# Patient Record
Sex: Male | Born: 1960 | Race: Black or African American | Hispanic: No | Marital: Married | Smoking: Current every day smoker
Health system: Southern US, Community
[De-identification: ages and names within clinical notes are randomized; demographics above are authoritative.]

## PROBLEM LIST (undated history)

## (undated) DIAGNOSIS — G2581 Restless legs syndrome: Secondary | ICD-10-CM

## (undated) DIAGNOSIS — I10 Essential (primary) hypertension: Secondary | ICD-10-CM

## (undated) DIAGNOSIS — N529 Male erectile dysfunction, unspecified: Secondary | ICD-10-CM

## (undated) DIAGNOSIS — M5430 Sciatica, unspecified side: Secondary | ICD-10-CM

---

## 2006-07-05 ENCOUNTER — Emergency Department: Payer: Self-pay | Admitting: Emergency Medicine

## 2007-09-22 ENCOUNTER — Other Ambulatory Visit: Payer: Self-pay

## 2007-09-22 ENCOUNTER — Emergency Department: Payer: Self-pay | Admitting: Emergency Medicine

## 2007-09-22 IMAGING — CR DG CHEST 1V PORT
1 series · 1 of 1 positions shown · non-contrast
Comparison: none

REASON FOR EXAM: chest pain
COMMENTS:

PROCEDURE:     DXR - DXR PORTABLE CHEST SINGLE VIEW  - September 22, 2007  [DATE]
RESULT:     The lung fields are clear. The heart, mediastinal and osseous
structures show no acute changes.

[view not recorded]
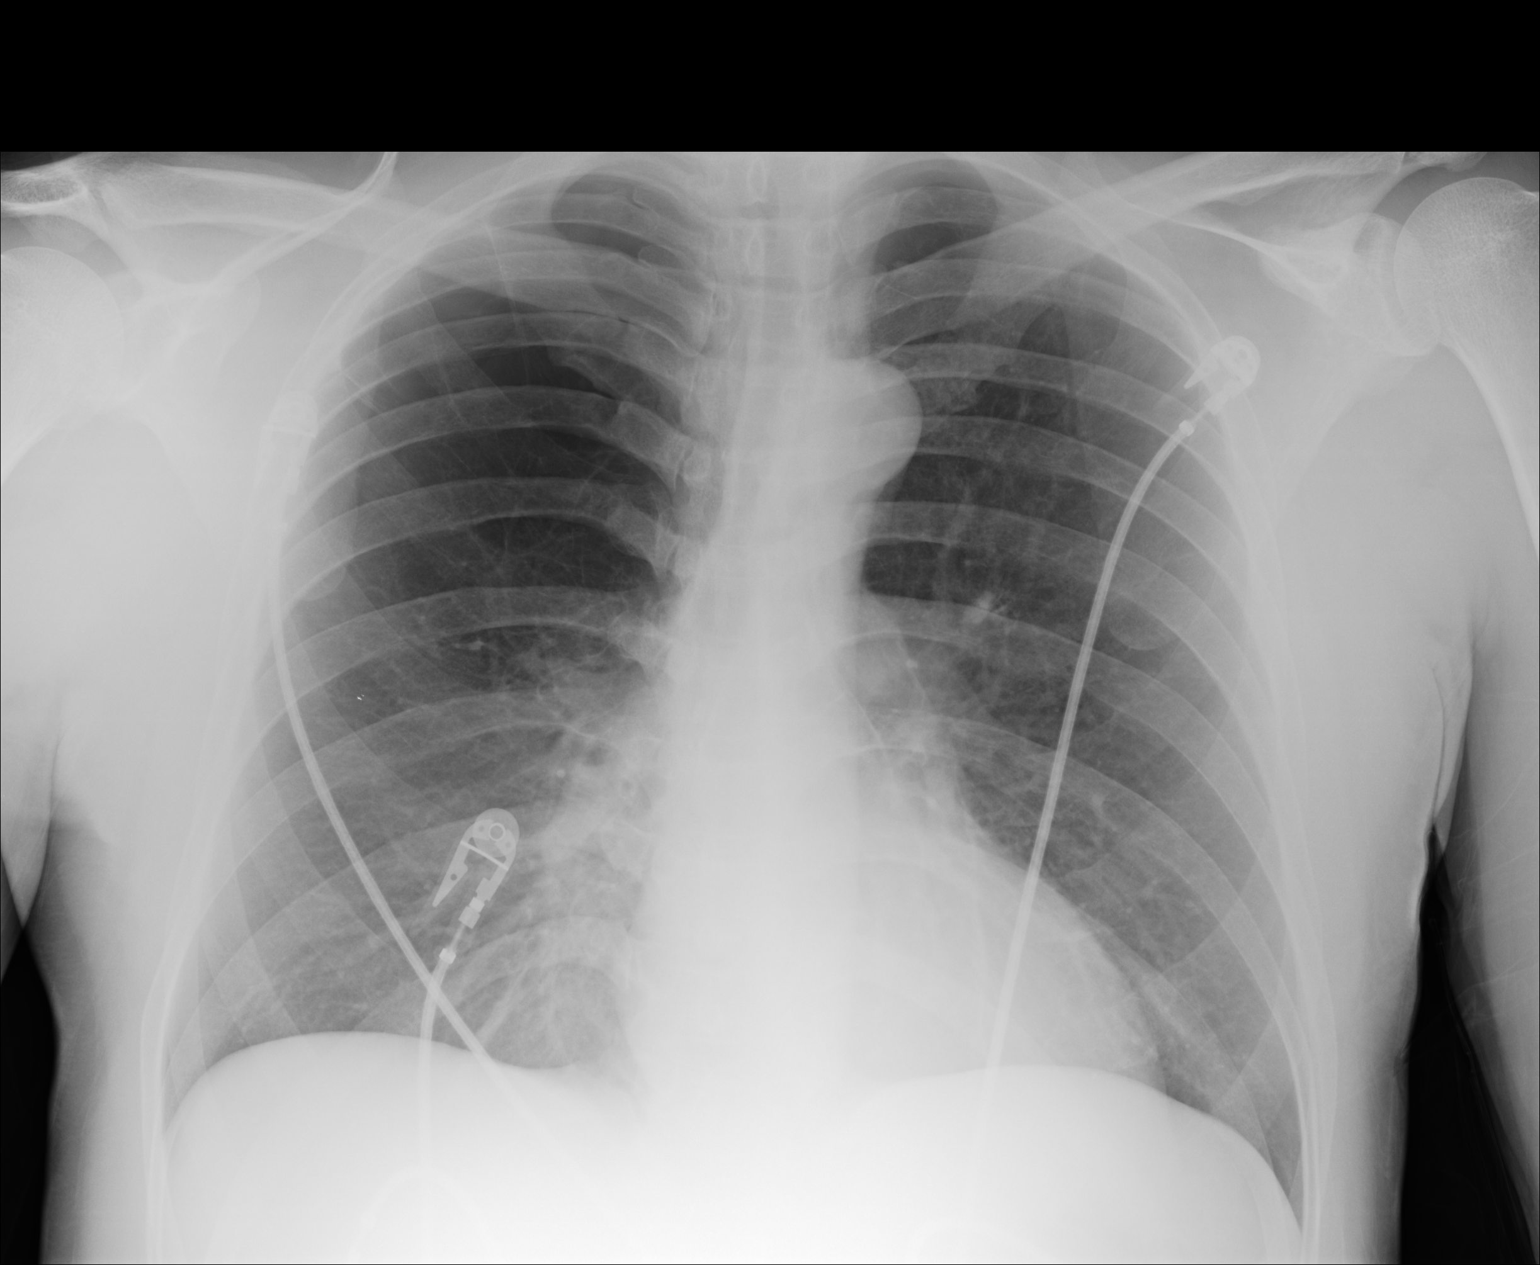

[1 of 1 positions shown; findings below may reference images not displayed]

IMPRESSION: 1.     No acute changes are identified.

## 2008-01-15 ENCOUNTER — Other Ambulatory Visit: Payer: Self-pay

## 2008-01-15 ENCOUNTER — Emergency Department: Payer: Self-pay | Admitting: Emergency Medicine

## 2008-01-15 IMAGING — CT CT HEAD WITHOUT CONTRAST
2 series · 16 of 30 positions shown, 20 images · non-contrast
Comparison: none

REASON FOR EXAM: headache htn
COMMENTS:

PROCEDURE:     CT  - CT HEAD WITHOUT CONTRAST  - January 15, 2008  [DATE]
RESULT:     Comparison: No comparison
TECHNIQUE: Multiple axial images from the foramen magnum to the vertex were
obtained without IV contrast.

[Series 2: without · axial · non-contrast · 0.39mm/px · z∈[-134,-10]mm · 13 of 31 slices shown, 17 images]
[im 3/31  brain]
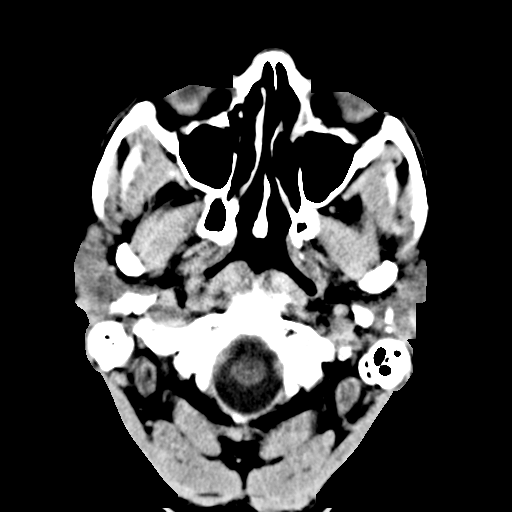
[im 3/31  bone]
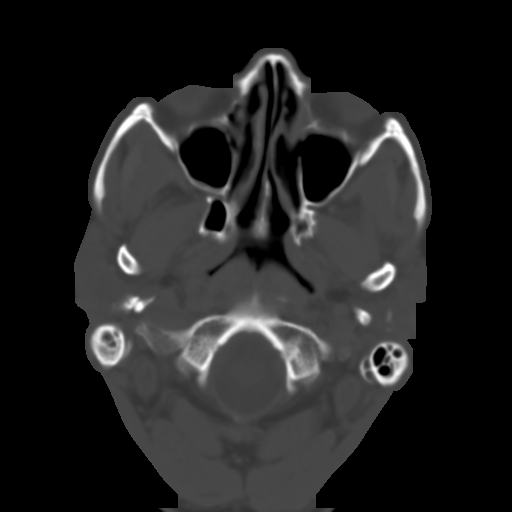
[im 5/31  brain]
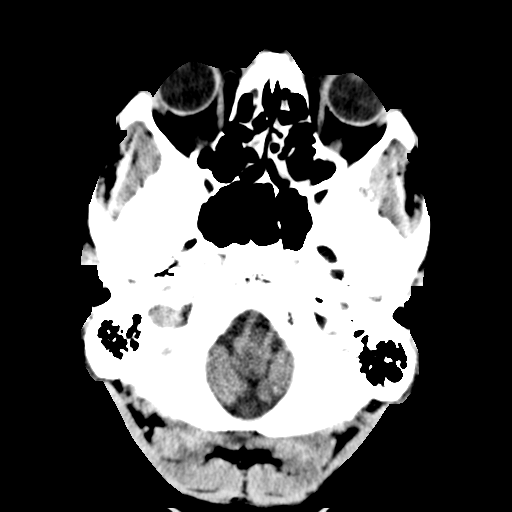
[im 7/31  brain]
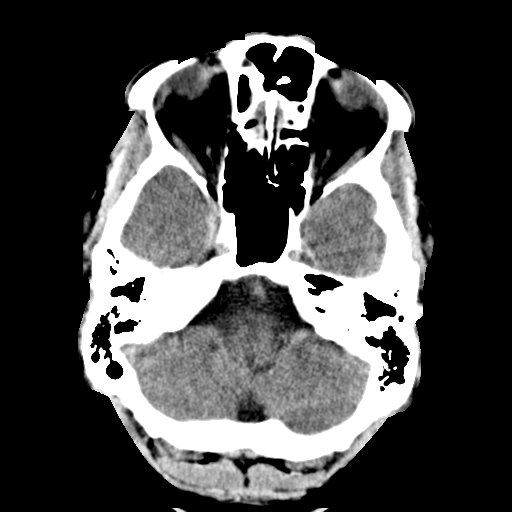
[im 9/31  brain]
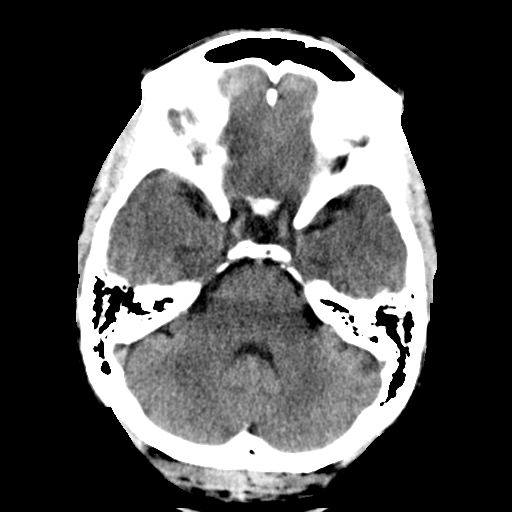
[im 11/31  brain]
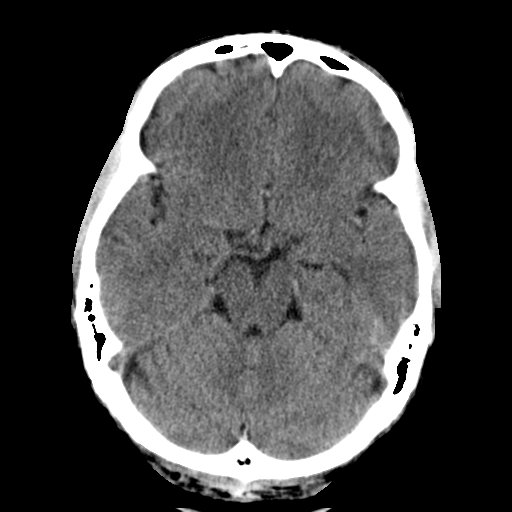
[im 11/31  bone]
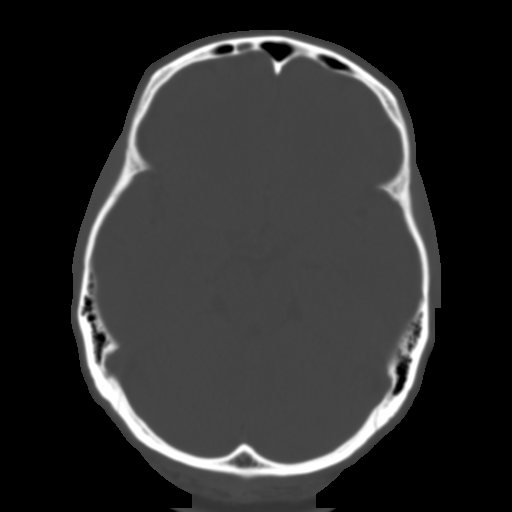
[im 13/31  brain]
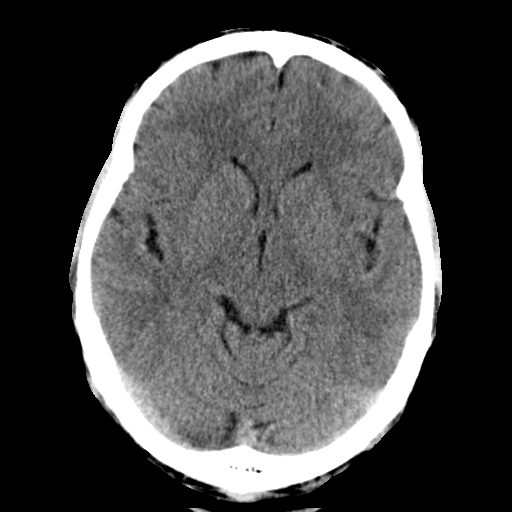
[im 16/31  brain]
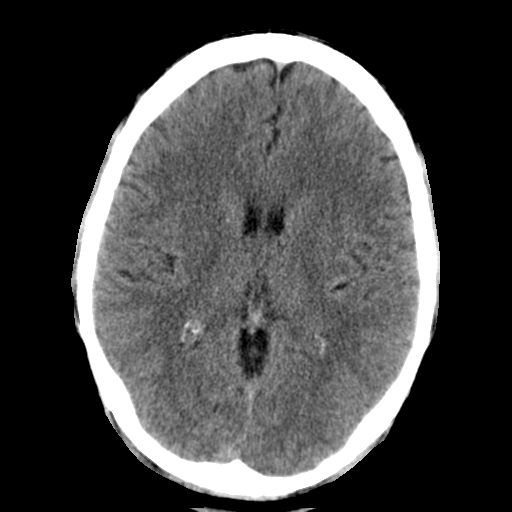
[im 18/31  brain]
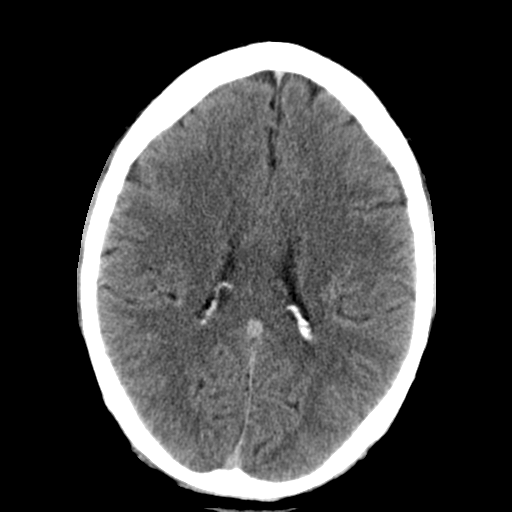
[im 20/31  brain]
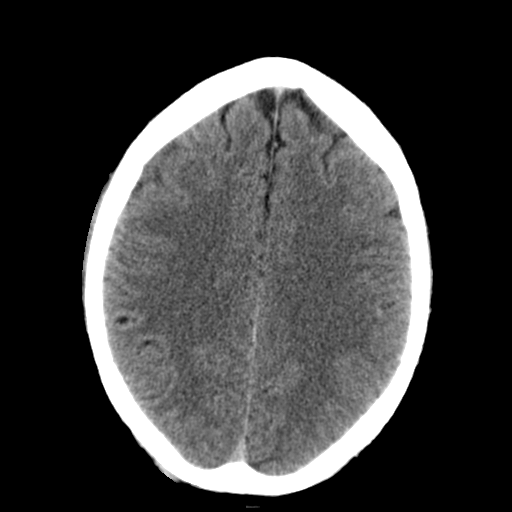
[im 20/31  bone]
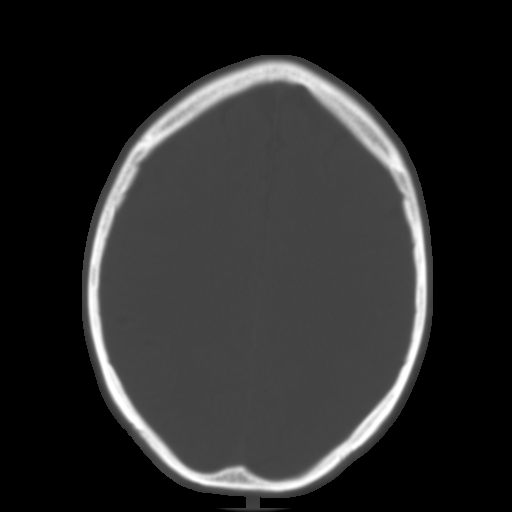
[im 22/31  brain]
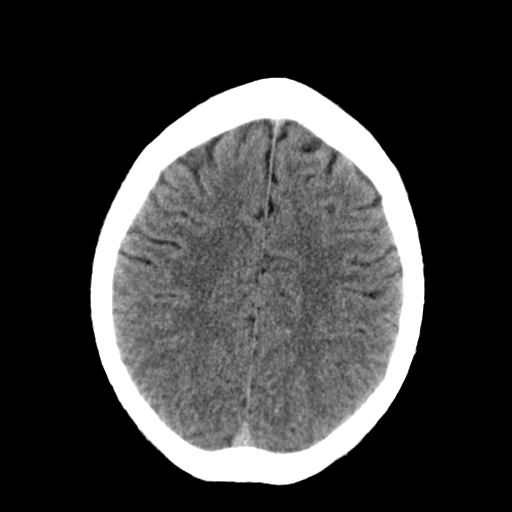
[im 24/31  brain]
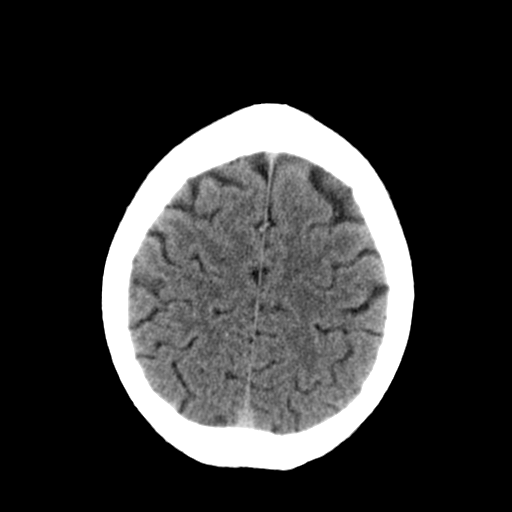
[im 26/31  brain]
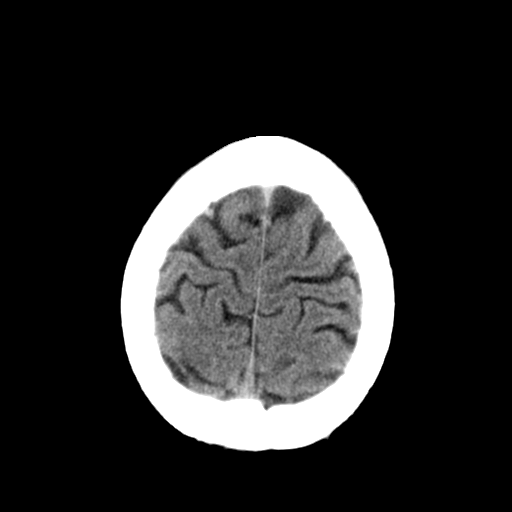
[im 28/31  brain]
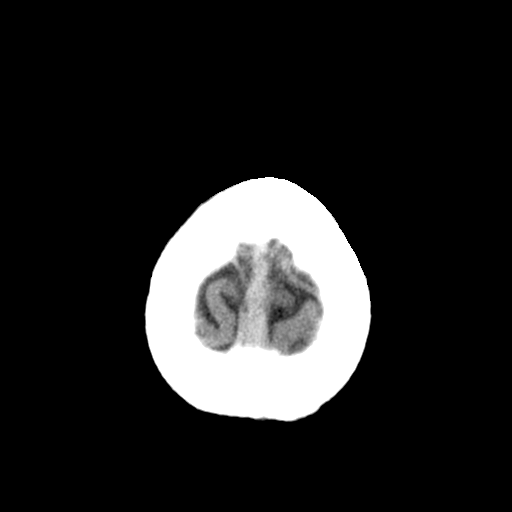
[im 28/31  bone]
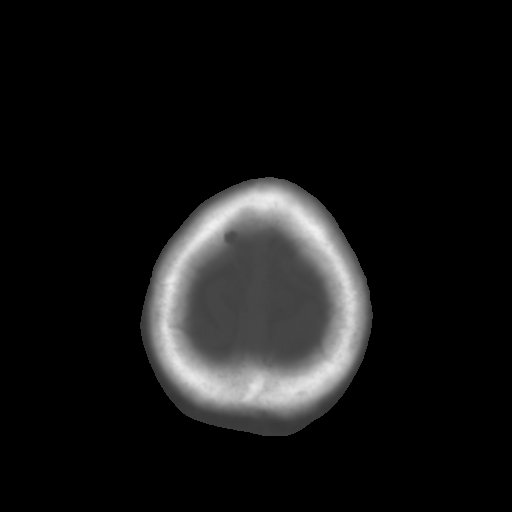

[Series 3: bone · axial · 0.39mm/px · z∈[-134,-94]mm · 3 of 31 slices shown]
[im 3/31  bone]
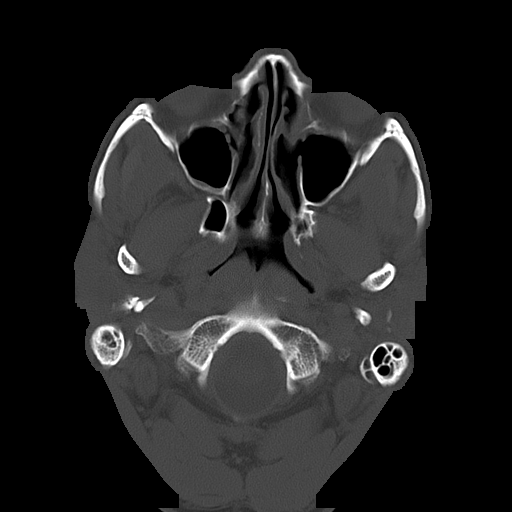
[im 7/31  bone]
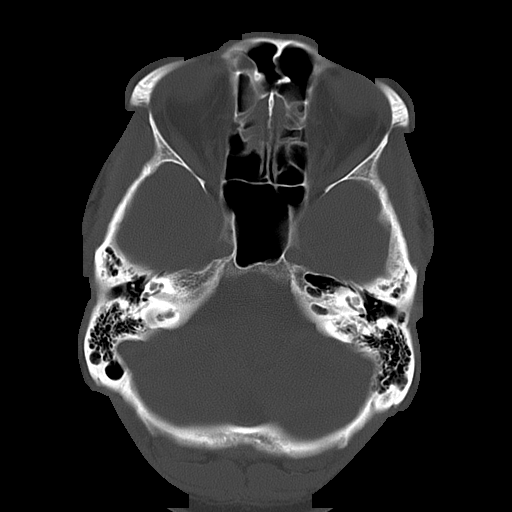
[im 11/31  bone]
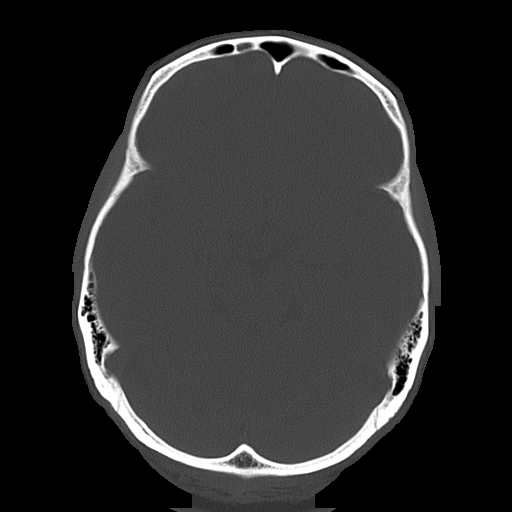

[16 of 30 positions shown; findings below may reference images not displayed]

FINDINGS: There is no evidence for mass effect, midline shift, or extra-axial fluid
collections.  There is no evidence for space-occupying lesion or
intracranial hemorrhage. There is no evidence for cortical-based area of
infarction.

Ventricles and sulci are appropriate for the patient's age. The basal
cisterns are patent.

Visualized portions of the orbits are unremarkable. Small air-fluid level in
the right maxillary sinus. The costal thickening in the ethmoid sinuses.

The osseous structures are unremarkable.
IMPRESSION: No acute intracranial process.
Sinus disease as described above.

## 2010-05-27 ENCOUNTER — Emergency Department: Payer: Self-pay | Admitting: Unknown Physician Specialty

## 2010-05-27 IMAGING — CT CT HEAD WITHOUT CONTRAST
2 series · 15 of 30 positions shown, 19 images · non-contrast
Comparison: none

REASON FOR EXAM: htn ha
COMMENTS:

[Series 2: without · axial · non-contrast · 0.43mm/px · z∈[-106,+20]mm · 13 of 31 slices shown, 17 images]
[im 3/31  brain]
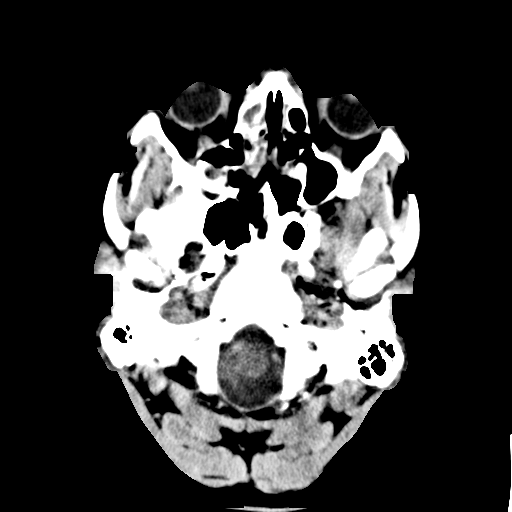
[im 3/31  bone]
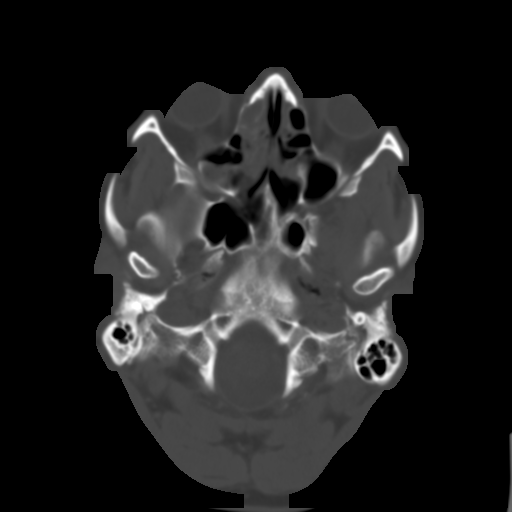
[im 5/31  brain]
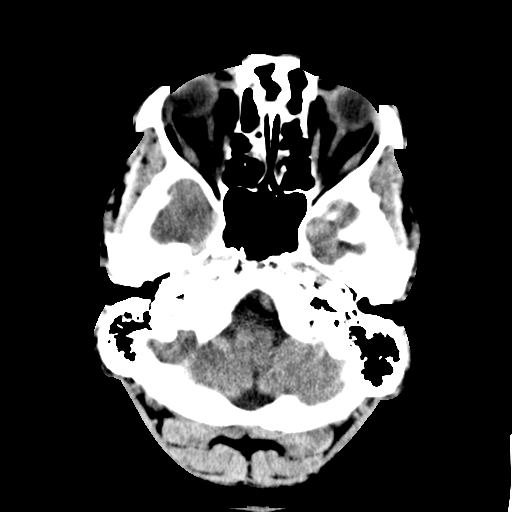
[im 7/31  brain]
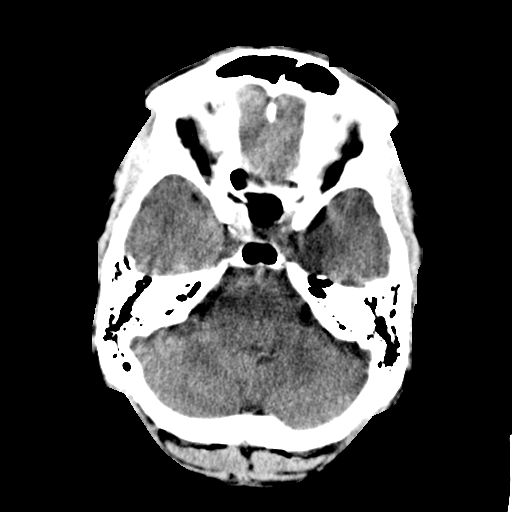
[im 9/31  brain]
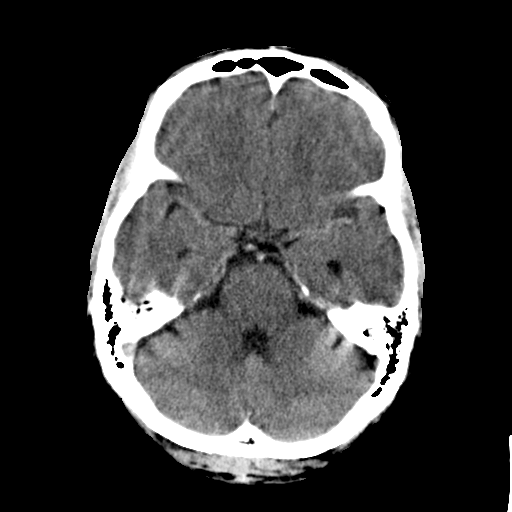
[im 11/31  brain]
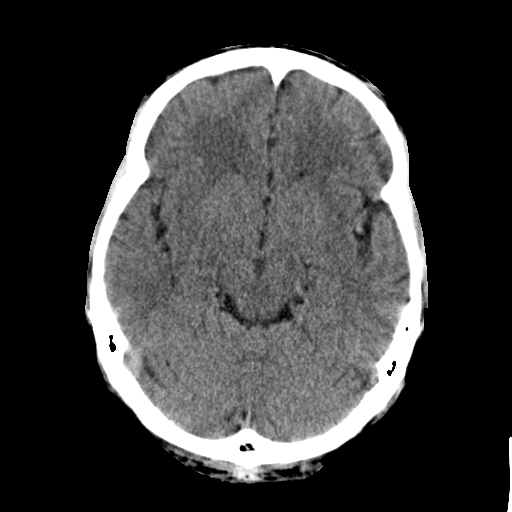
[im 11/31  bone]
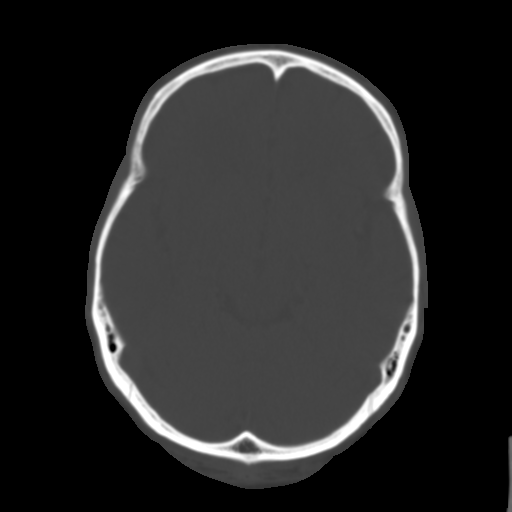
[im 13/31  brain]
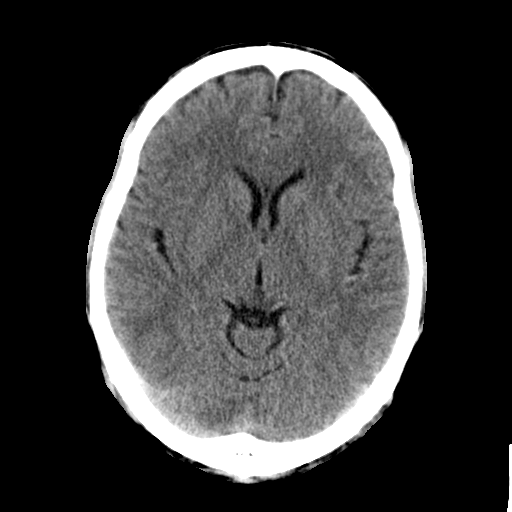
[im 16/31  brain]
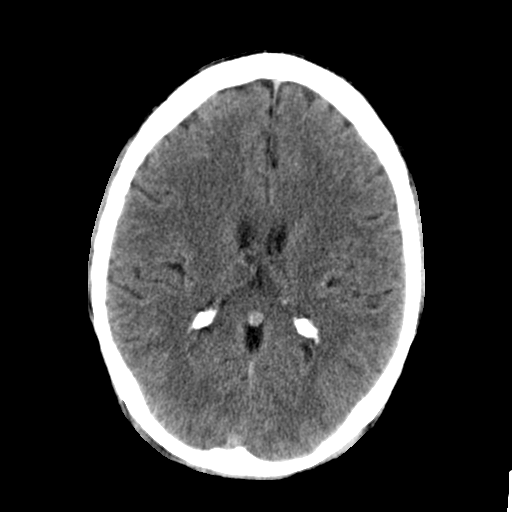
[im 18/31  brain]
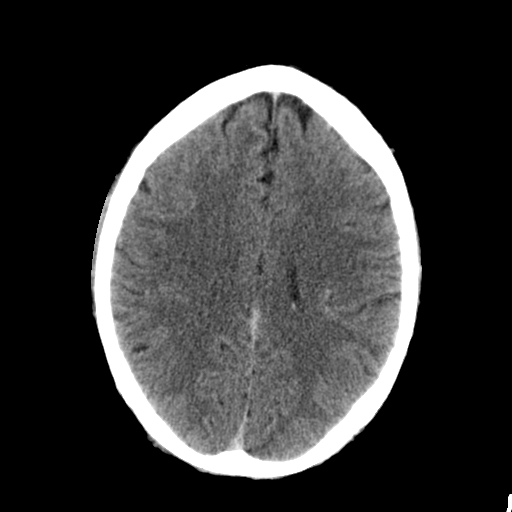
[im 20/31  brain]
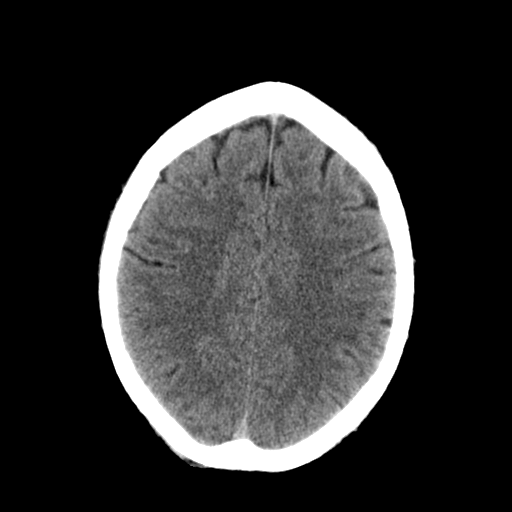
[im 20/31  bone]
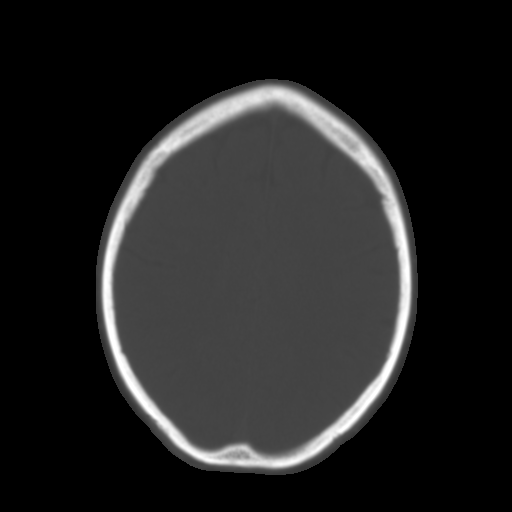
[im 22/31  brain]
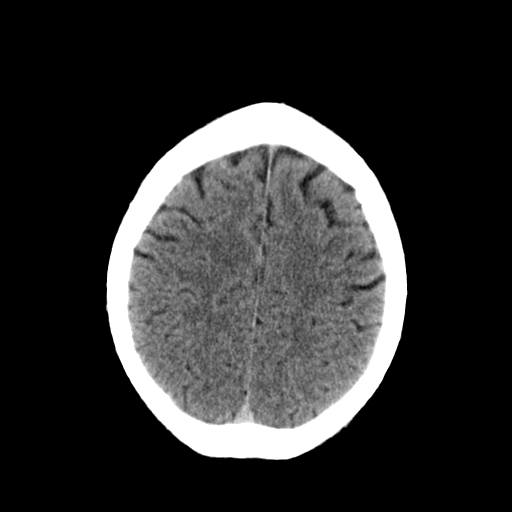
[im 24/31  brain]
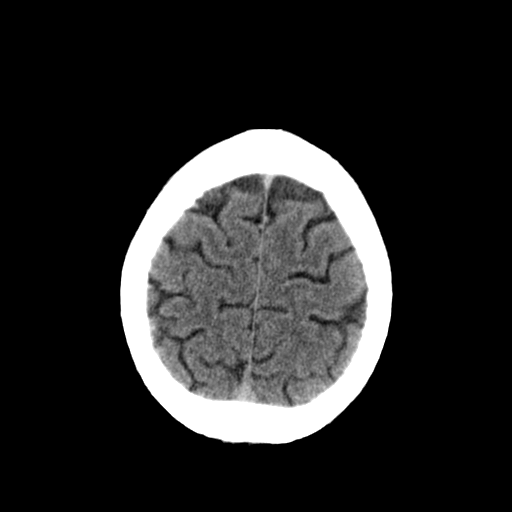
[im 26/31  brain]
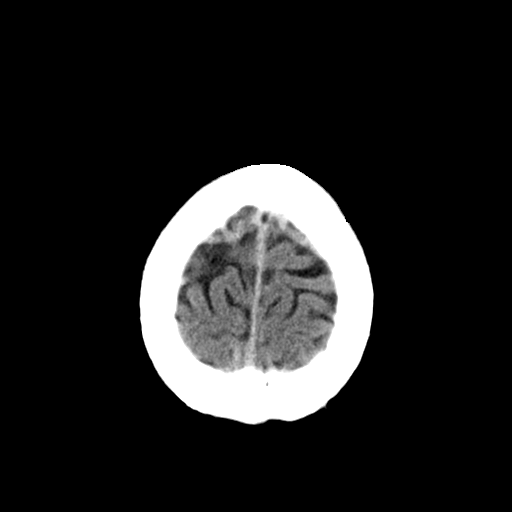
[im 28/31  brain]
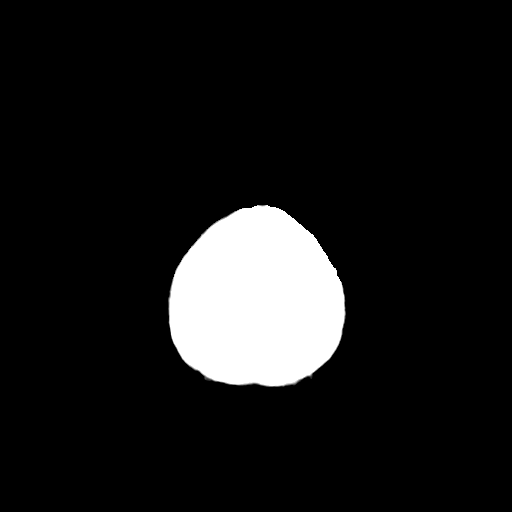
[im 28/31  bone]
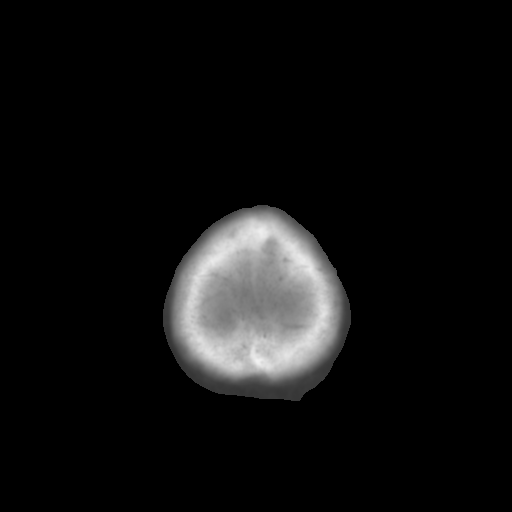

[Series 3: bone · axial · 0.43mm/px · z∈[-106,-86]mm · 2 of 31 slices shown]
[im 3/31  bone]
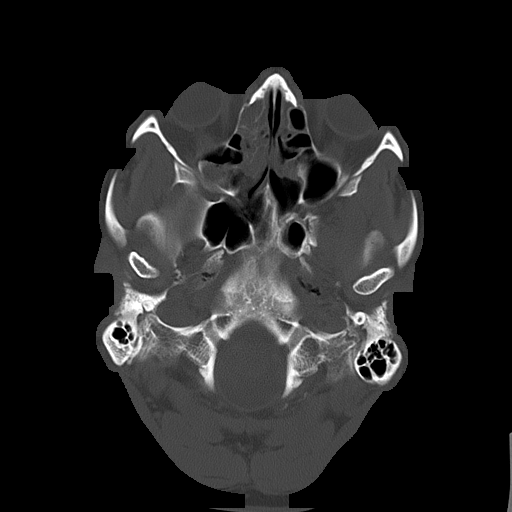
[im 7/31  bone]
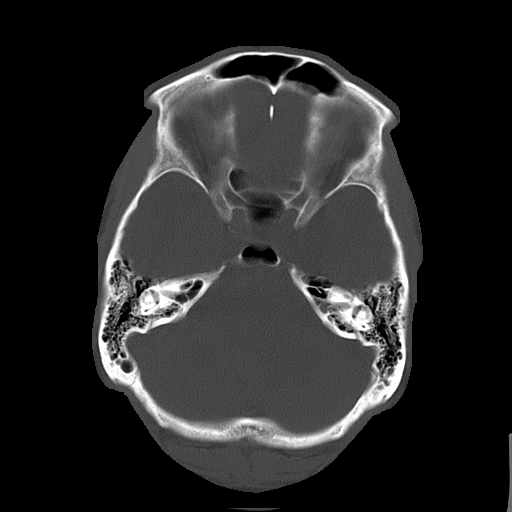

[15 of 30 positions shown; findings below may reference images not displayed]

PROCEDURE:     CT  - CT HEAD WITHOUT CONTRAST  - May 27, 2010  [DATE]

RESULT:     Axial noncontrast CT scanning was performed through the brain at
5 mm intervals and slice thicknesses. Comparison is made to the study 15 January, 2008.

The ventricles are normal in size and position. There is no intracranial
hemorrhage nor intracranial mass effect. The cerebellum and brainstem are
normal in density. At bone window settings there is mucoperiosteal
thickening within the ethmoid sinuses bilaterally as well as within the
right maxillary sinus. There may be a small amount of fluid in the right
maxillary sinus as well. There is no evidence of an acute skull fracture.
IMPRESSION: 1. I do not see evidence of an acute intracranial hemorrhage nor of an
evolving ischemic infarction.
2. There is no intracranial mass effect or hydrocephalus.
3. There is sinus inflammation involving the ethmoid and right maxillary
sinuses. There may be a small air-fluid level in the right maxillary sinus.

## 2010-05-27 IMAGING — CR DG CHEST 2V
1 series · 2 of 2 positions shown · non-contrast
Comparison: none

REASON FOR EXAM: htn
COMMENTS:

[Series 1: view not recorded · 0.17mm/px · 2 of 2 slices shown]
[im 1/2]
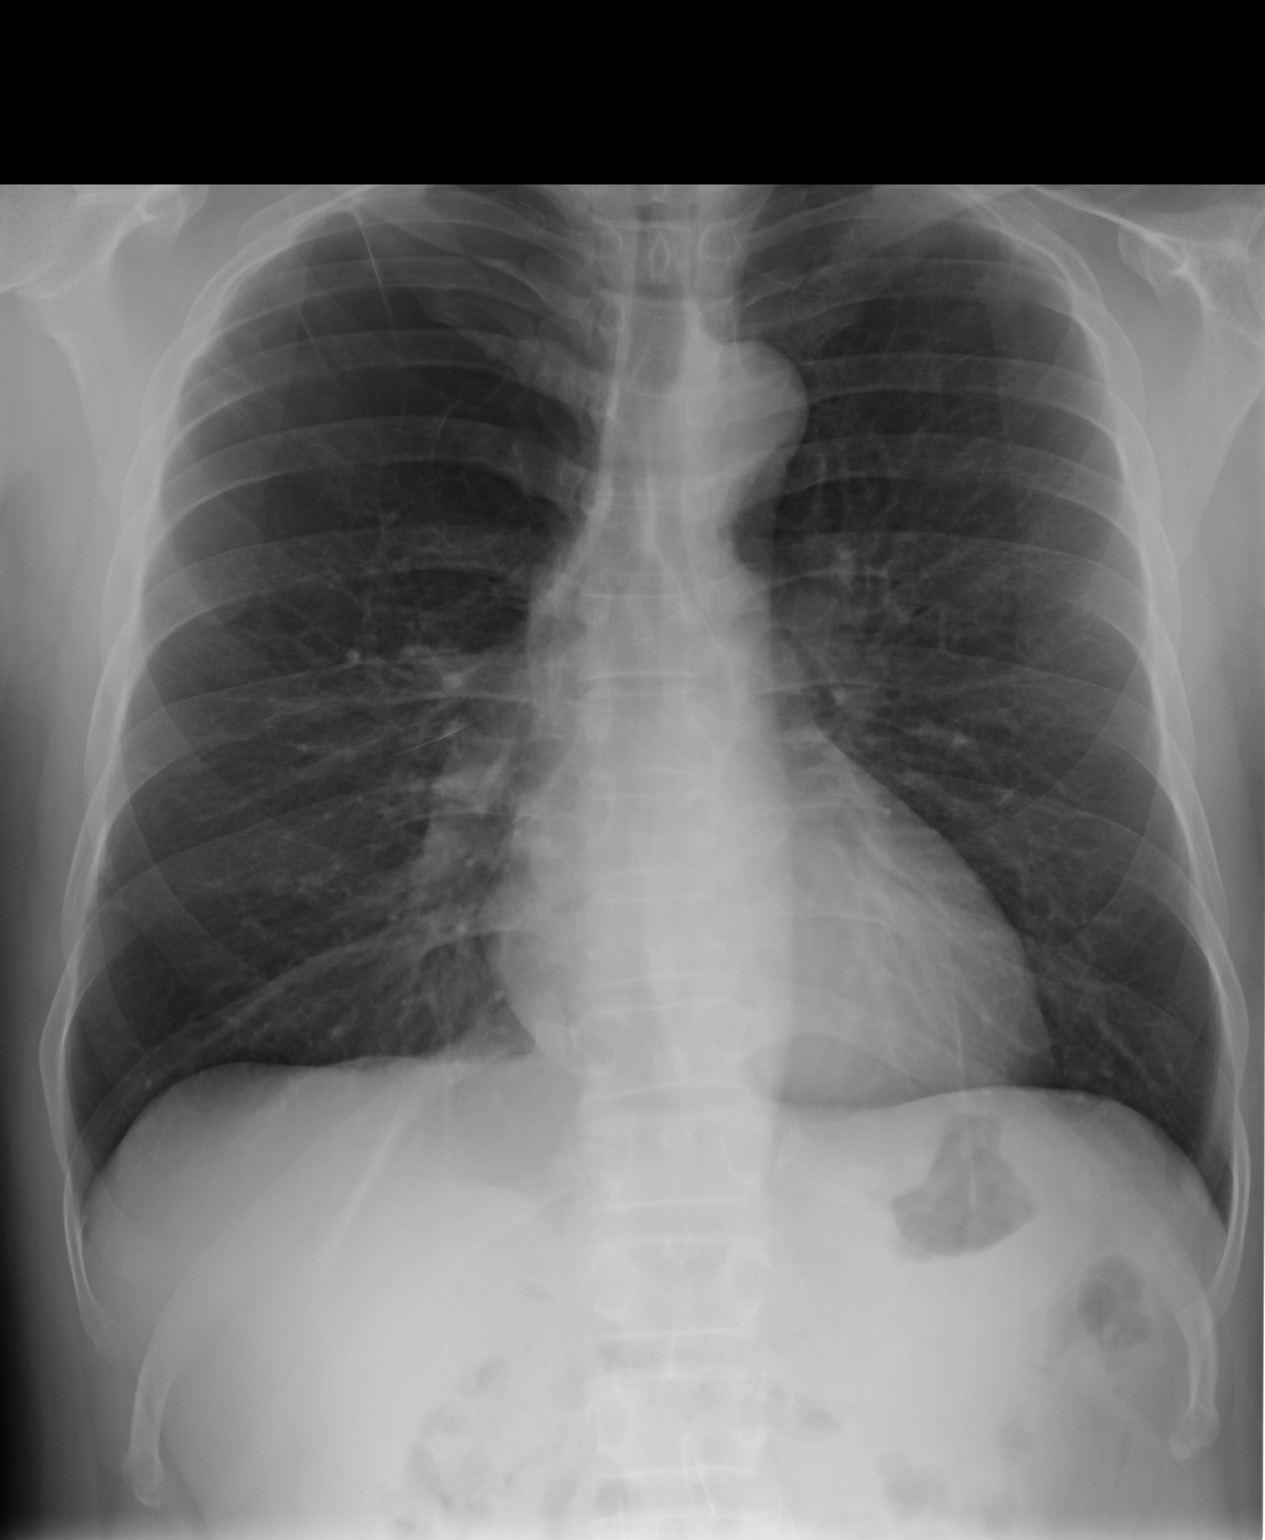
[im 2/2]
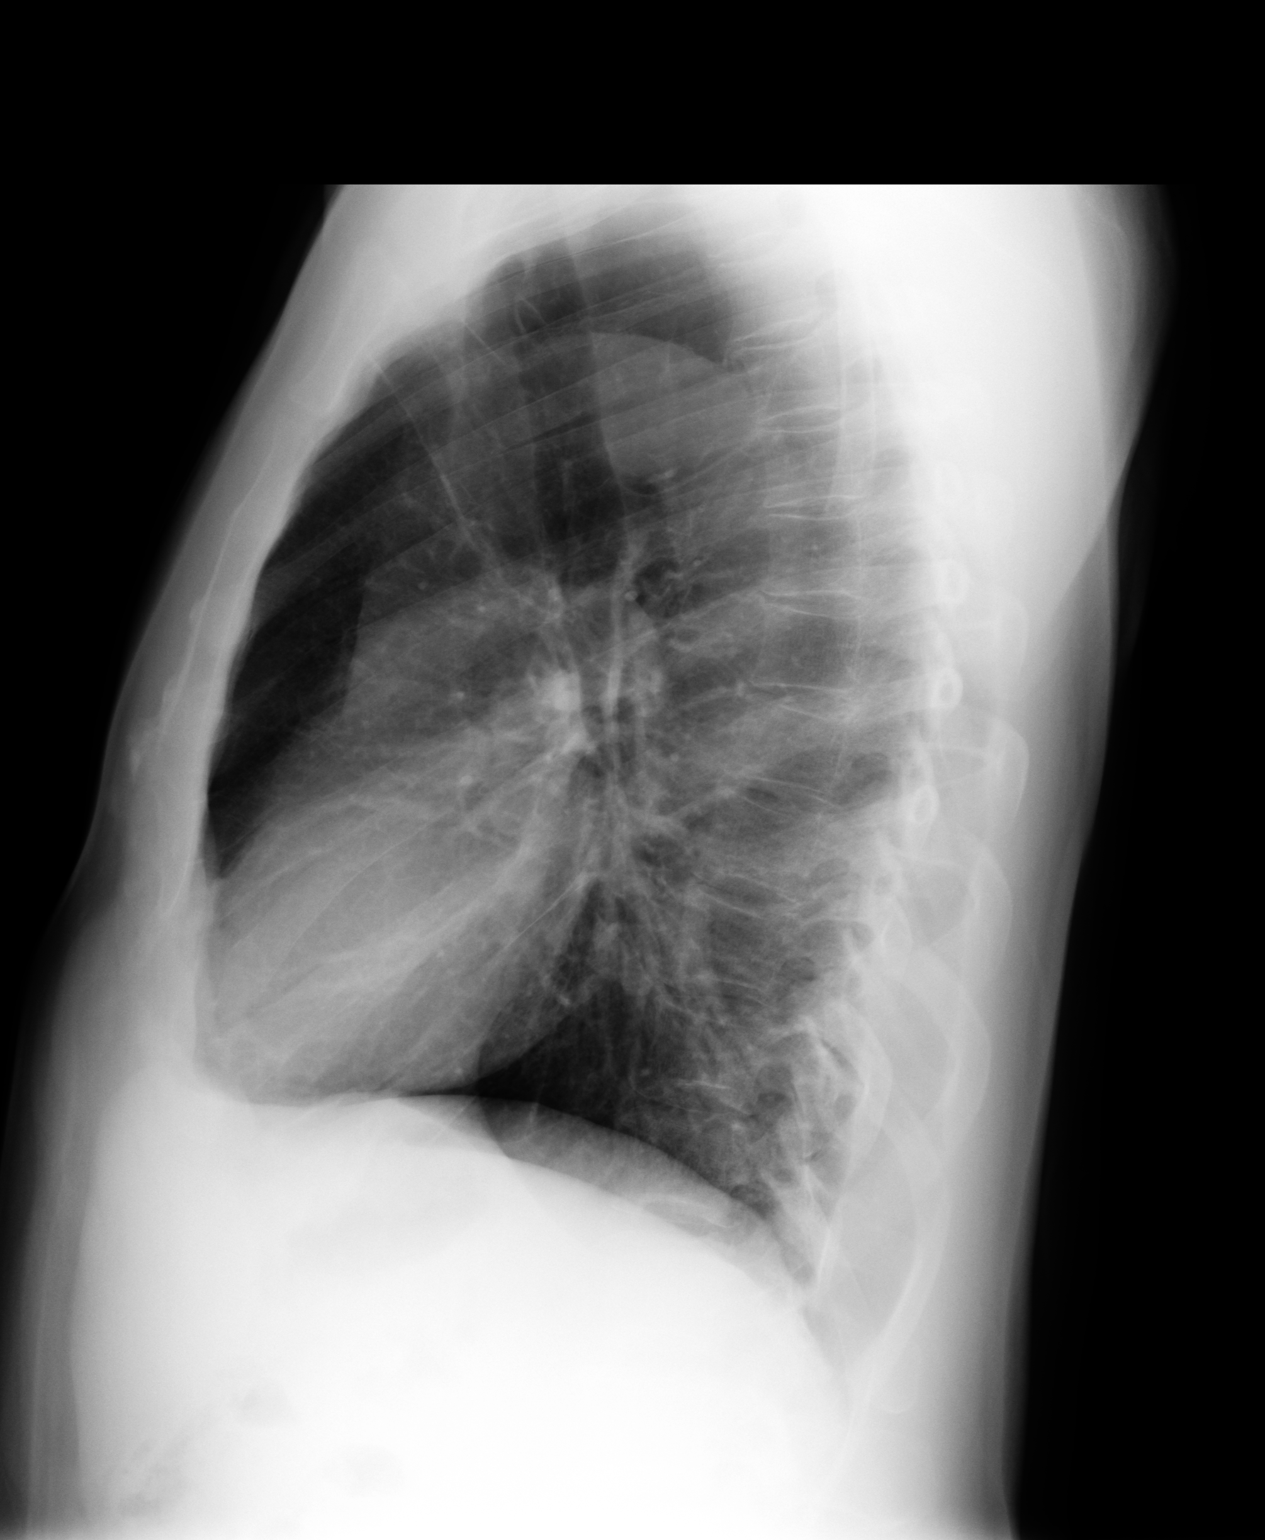

[2 of 2 positions shown; findings below may reference images not displayed]

PROCEDURE:     DXR - DXR CHEST PA (OR AP) AND LATERAL  - May 27, 2010  [DATE]

RESULT:     Comparison made to study 22 September, 2007.

The lungs are mildly hyperinflated. There is hemidiaphragm flattening on the
lateral film. The cardiac silhouette is normal in size. The pulmonary
vascularity is not engorged. There may be bullous lesions in the right
pulmonary apex. The mediastinum is not abnormally widened. I see no pleural
effusion.
IMPRESSION: The findings are consistent with COPD. I do not see
evidence of CHF nor of pneumonia.

## 2010-09-13 ENCOUNTER — Emergency Department: Payer: Self-pay | Admitting: Emergency Medicine

## 2010-09-13 IMAGING — US US EXTREM LOW VENOUS*R*
1 series · 14 of 24 positions shown · non-contrast
Comparison: none

REASON FOR EXAM: leg pain   pt in ED waiting room
COMMENTS:

[Series 1: us extrem low venous*right* · 14 of 24 slices shown]
[im 1/24]
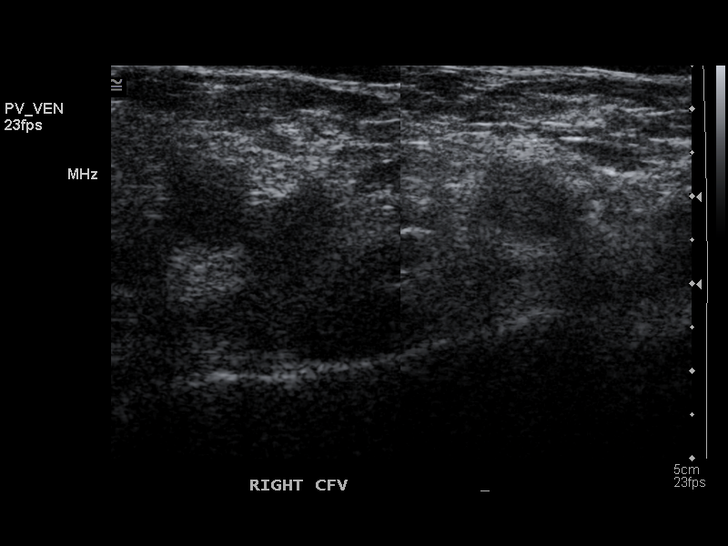
[im 3/24]
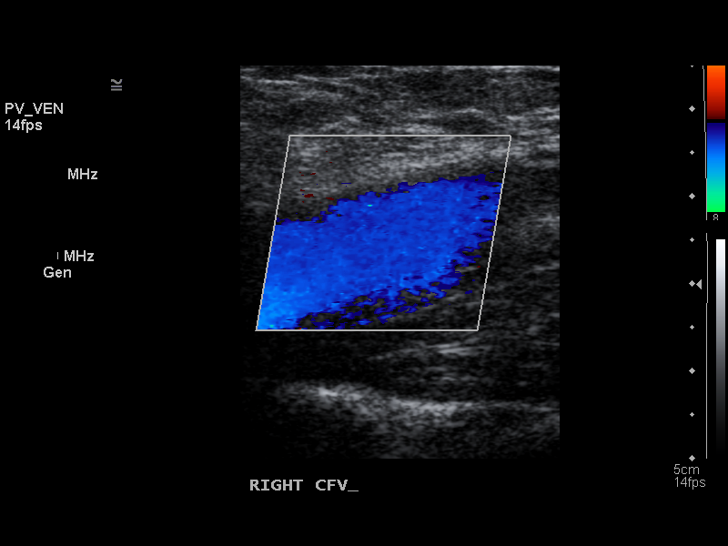
[im 5/24]
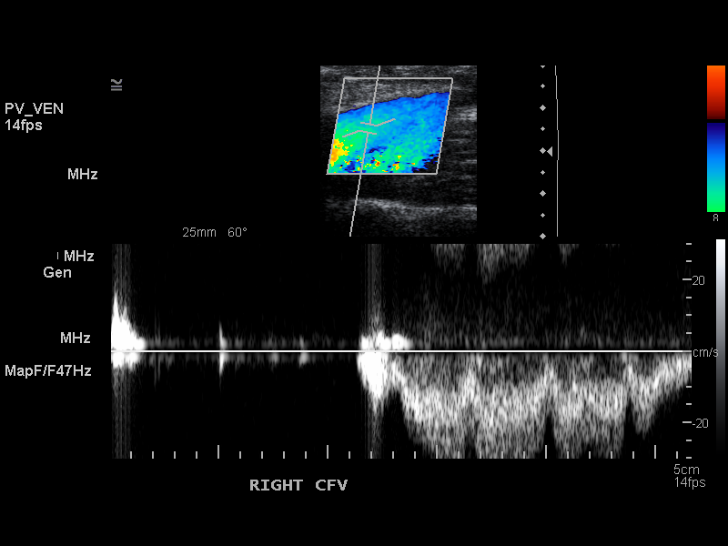
[im 7/24]
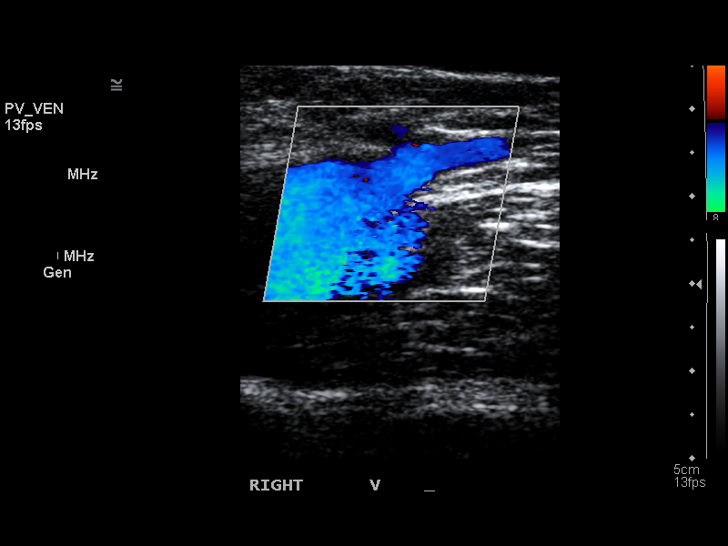
[im 8/24]
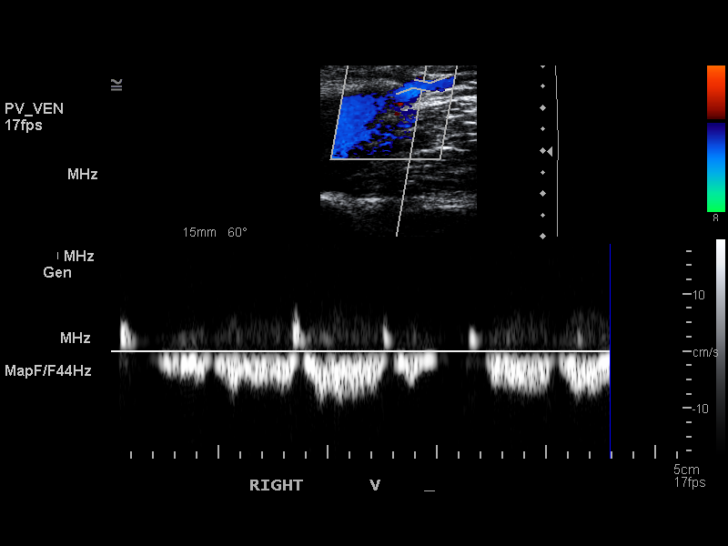
[im 10/24]
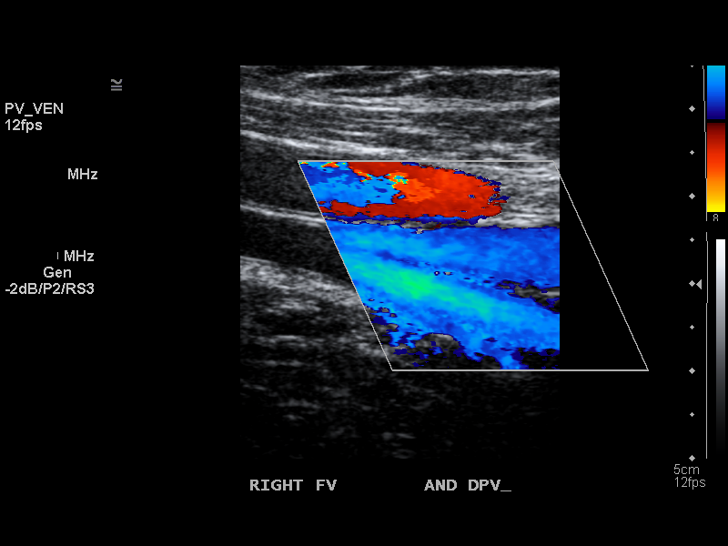
[im 12/24]
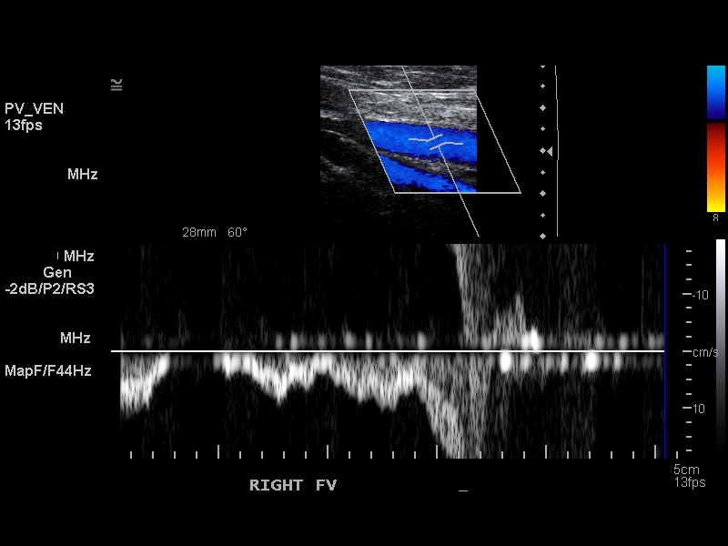
[im 13/24]
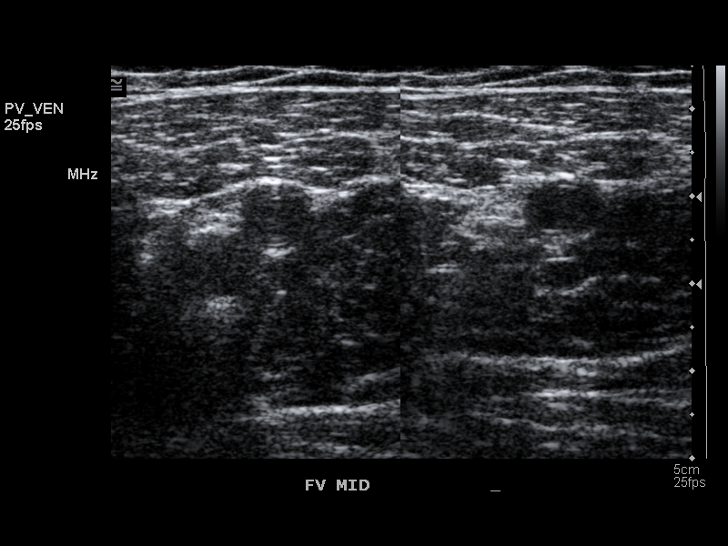
[im 15/24]
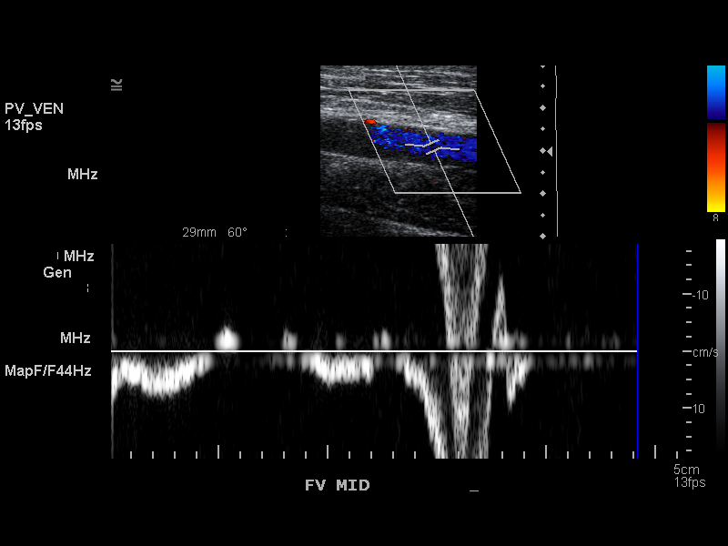
[im 17/24]
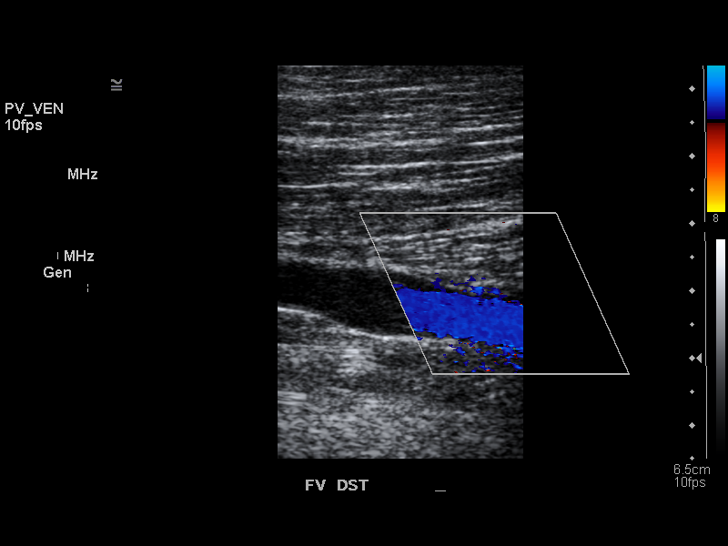
[im 19/24]
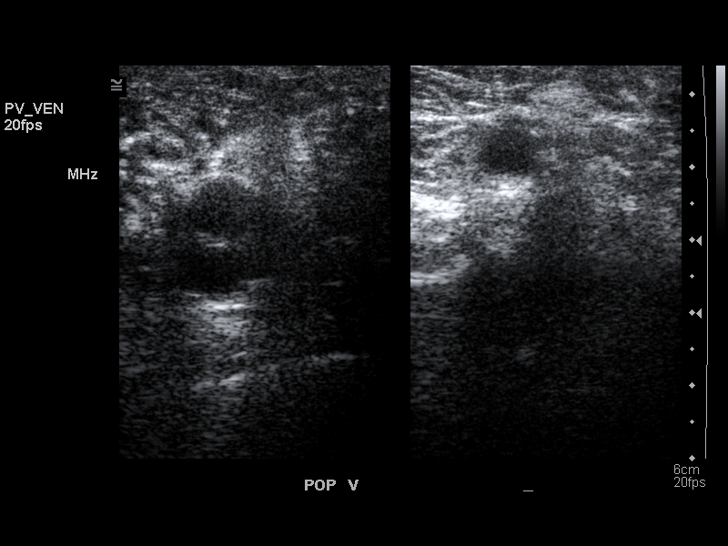
[im 20/24]
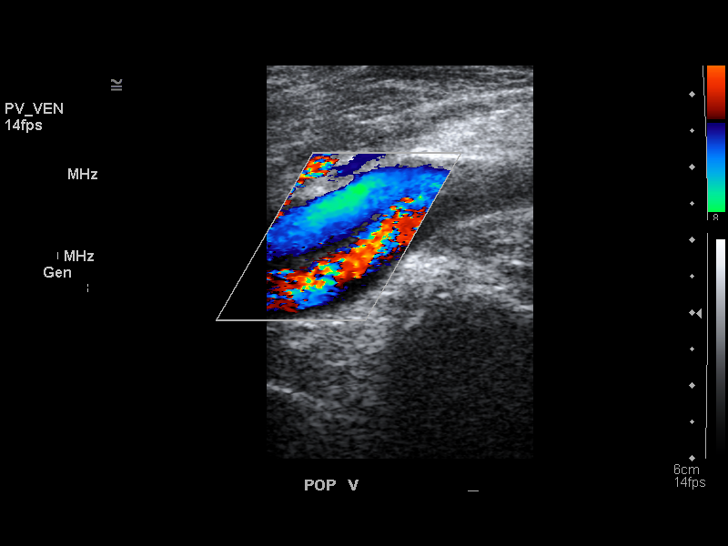
[im 22/24]
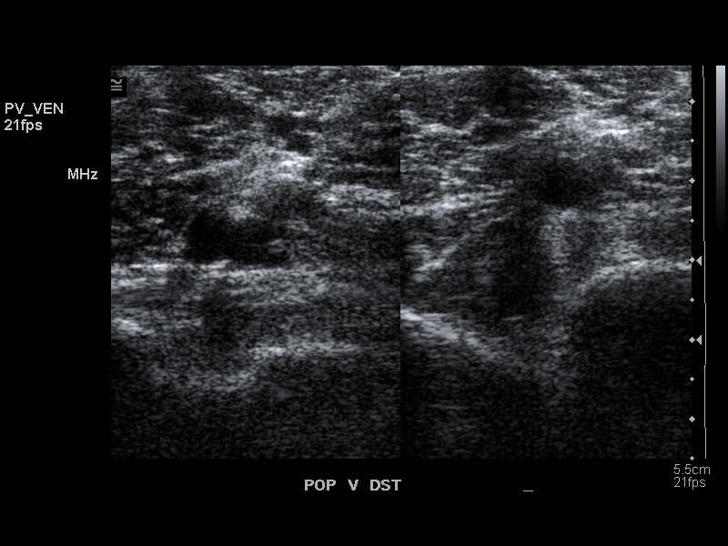
[im 24/24]
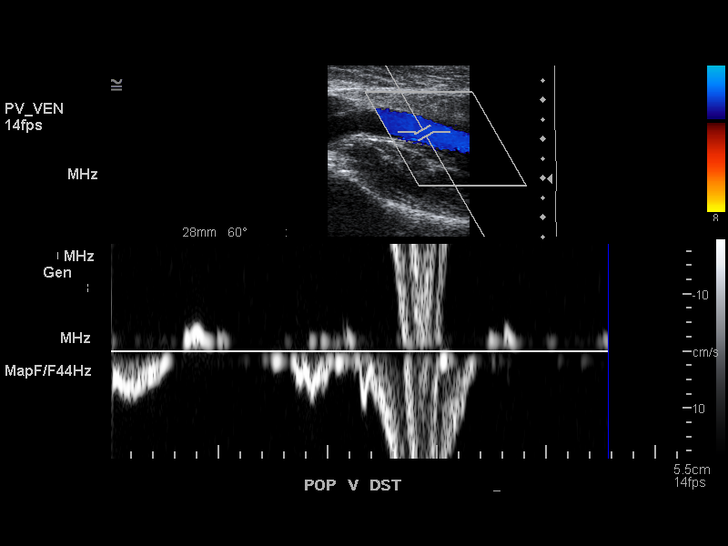

[14 of 24 positions shown; findings below may reference images not displayed]

PROCEDURE:     US  - US DOPPLER LOW EXTR RIGHT  - September 13, 2010 [DATE]

RESULT:     The phasic, augmentation and Valsalva flow waveforms are normal
in appearance. The right femoral and popliteal vein shows complete
compressibility throughout its course. Doppler examination shows no
occlusion or evidence for deep vein thrombosis.
IMPRESSION: 1.     No deep vein thrombosis is identified in the right leg.

## 2011-02-01 ENCOUNTER — Emergency Department: Payer: Self-pay | Admitting: *Deleted

## 2011-02-01 IMAGING — CR DG ABDOMEN 3V
1 series · 4 of 4 positions shown · non-contrast
Comparison: none

REASON FOR EXAM: Pain
COMMENTS:   May transport without cardiac monitor

[Series 1: view not recorded · 0.17mm/px · 4 of 4 slices shown]
[im 1/4]
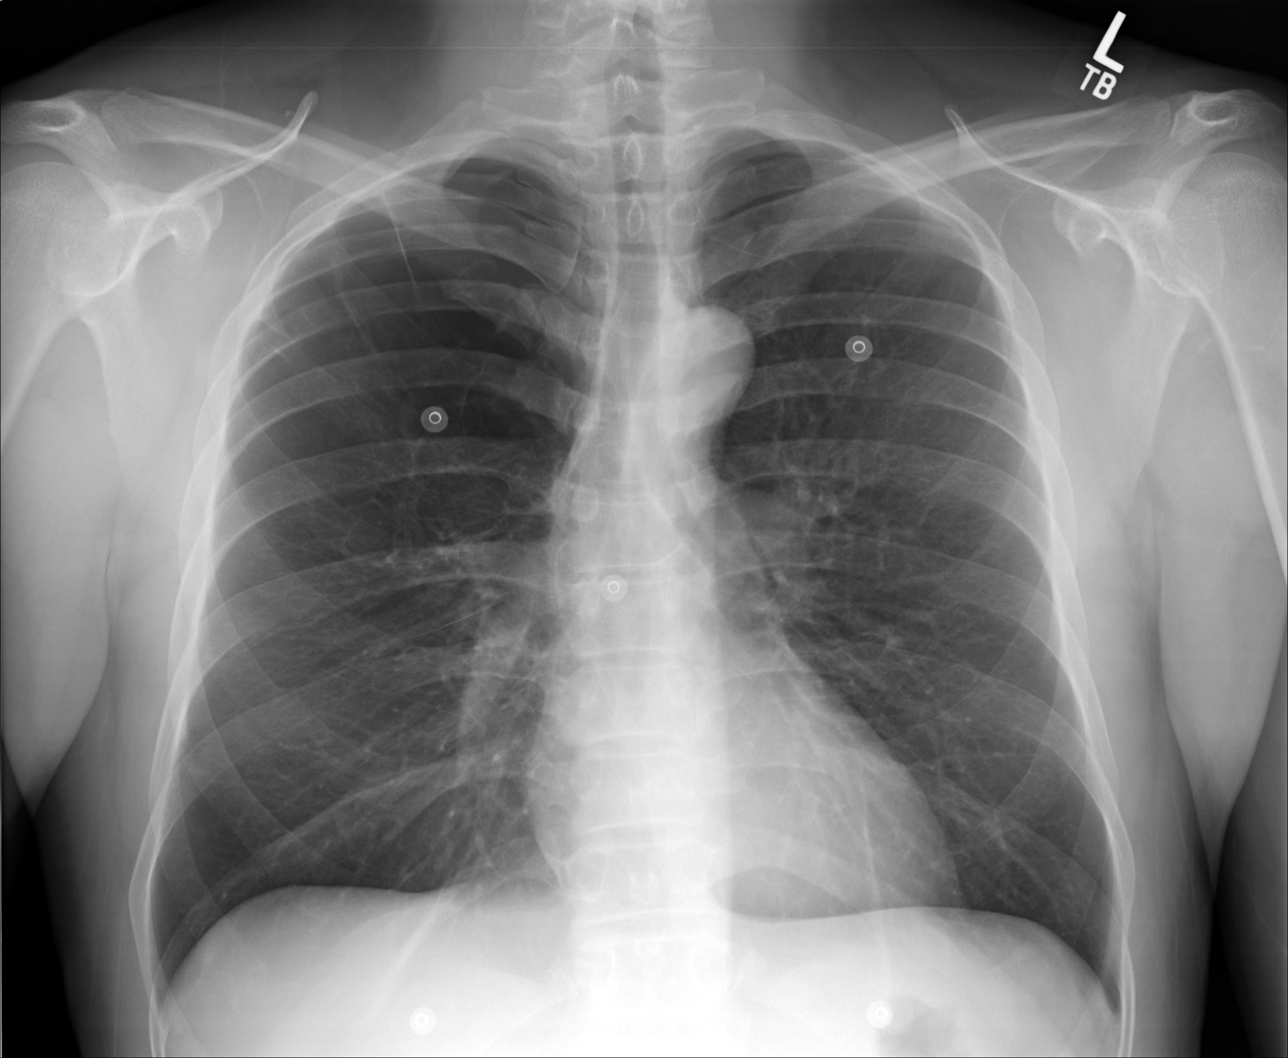
[im 2/4]
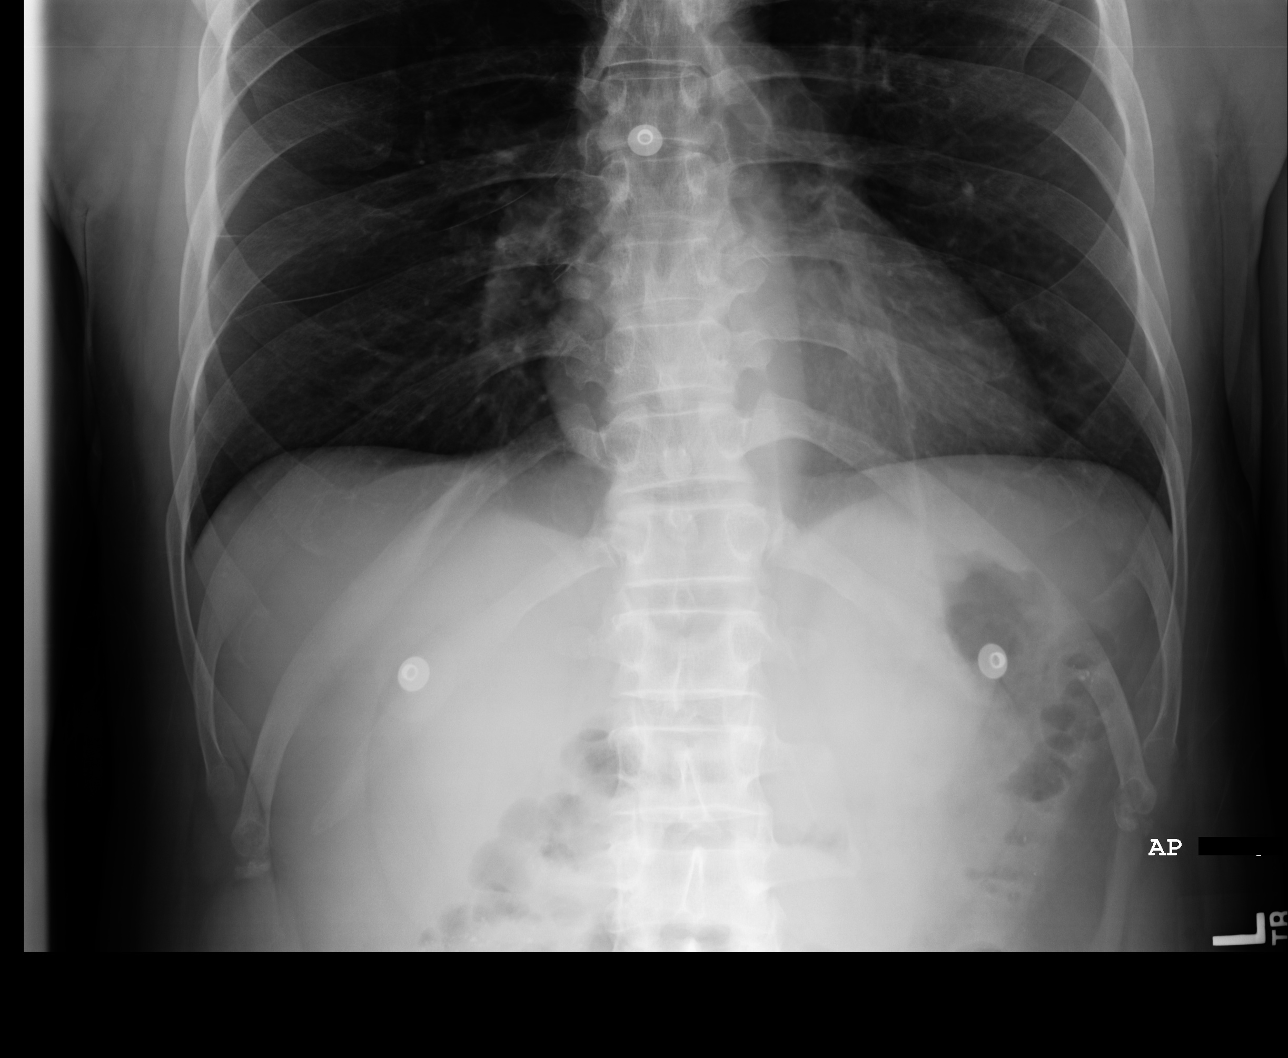
[im 3/4]
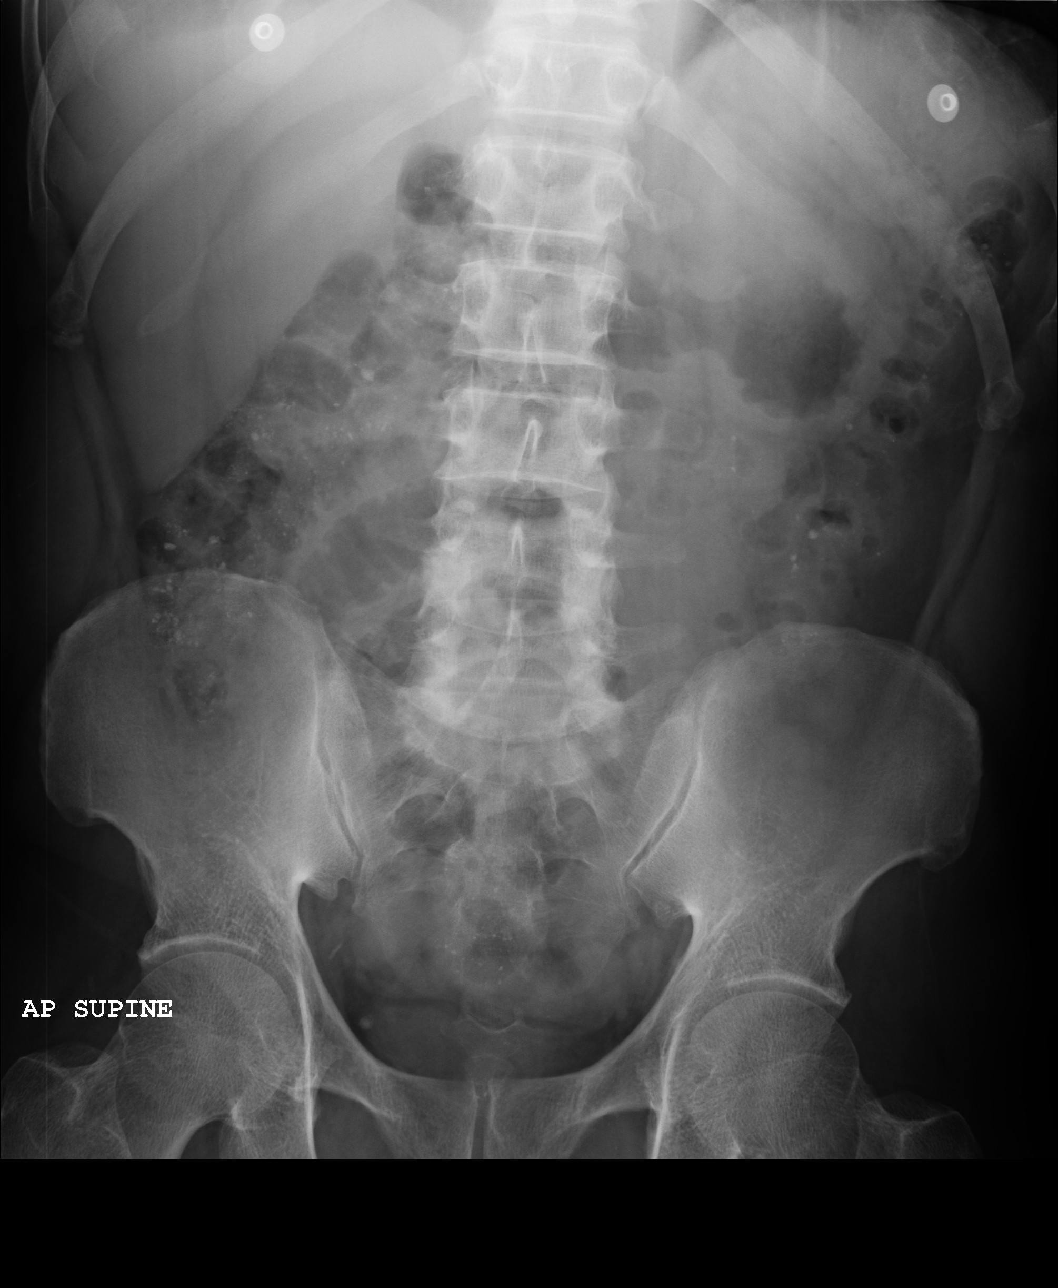
[im 4/4]
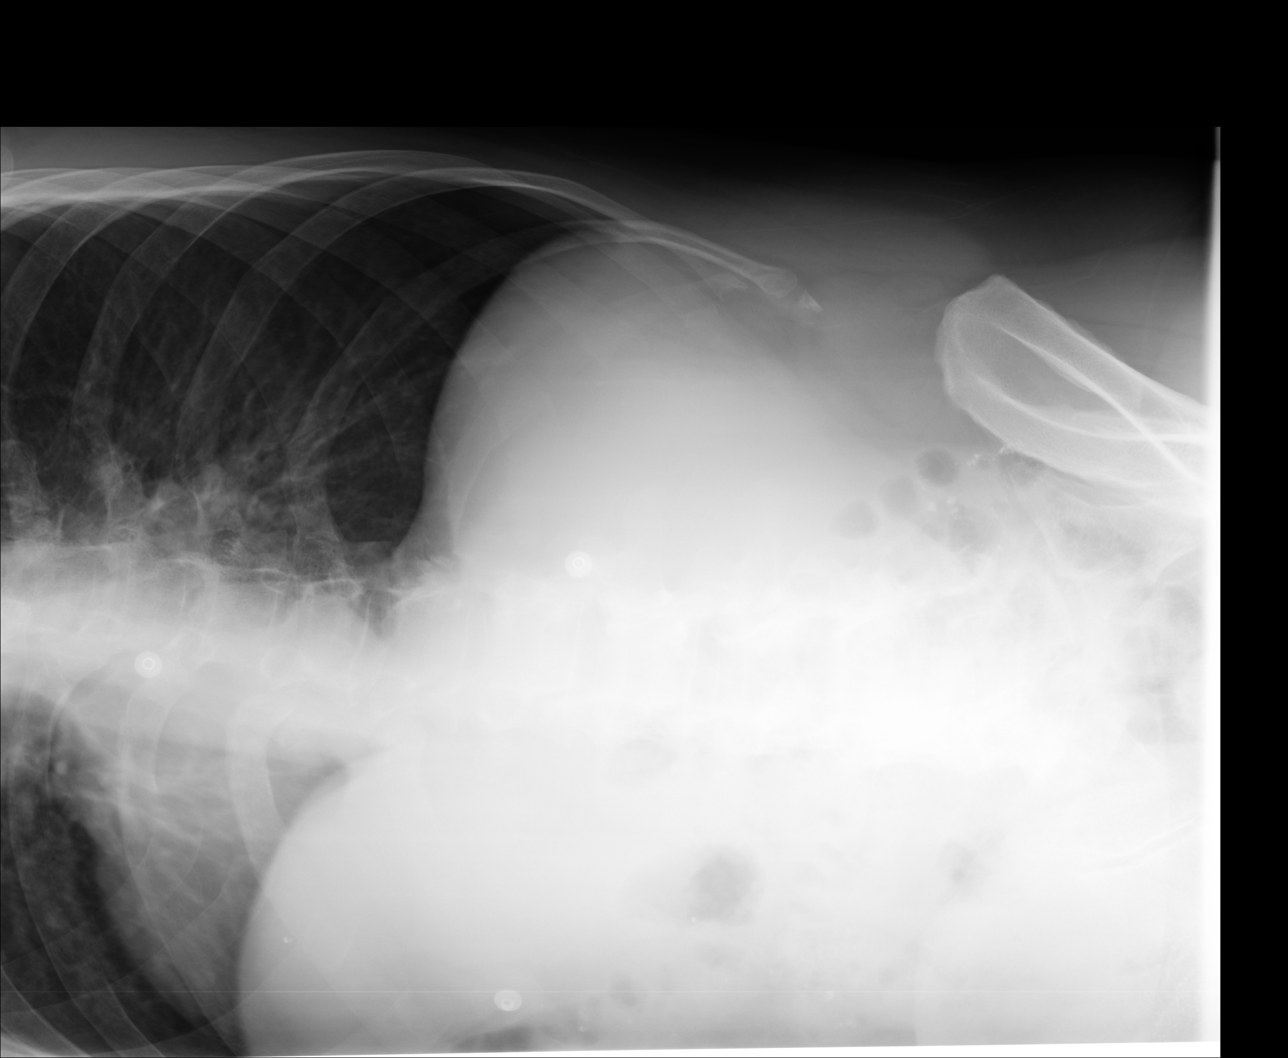

[4 of 4 positions shown; findings below may reference images not displayed]

PROCEDURE:     DXR - DXR ABDOMEN 3-WAY (INCL PA CXR)  - February 01, 2011 [DATE]

RESULT:     PA view of the chest shows the lung fields to be clear. Bullous
lesions are again noted at the right apex. The heart size remains normal.

Three views of the abdomen were obtained. No subdiaphragmatic free air is
seen. The bowel gas pattern shows no specific abnormalities. No dilated
bowel loops suspicious for bowel obstruction are noted. No subdiaphragmatic
free air is seen. No air-fluid levels are observed in the erect and lateral
decubitus views. There is a small amount of opaque material in the colon
that likely represents residual from prior ingestion of opaque medications.
No abnormal intra-abdominal calcifications are seen. No acute bony
abnormalities are identified.
IMPRESSION: No bowel obstruction or other acute change is identified.

## 2011-02-01 IMAGING — CT CT ABD-PELV W/O CM
1 of 2 series · 15 of 32 positions shown, 19 images · non-contrast
Comparison: none

REASON FOR EXAM: (1) pain; (2) pain
COMMENTS:

PROCEDURE:     CT  - CT ABDOMEN AND PELVIS W[DATE] [DATE]
RESULT:     Comparison: None
TECHNIQUE: Multiple axial images from the lung bases to the symphysis pubis
were obtained with oral and without intravenous contrast.

[Series 2: 3mm soft tissue · axial · 0.72mm/px · z∈[-327,+54]mm · 15 of 139 slices shown, 19 images]
[im 6/139  soft-tissue]
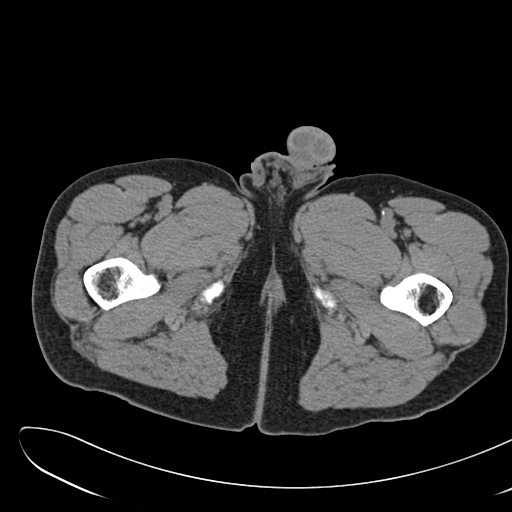
[im 6/139  bone]
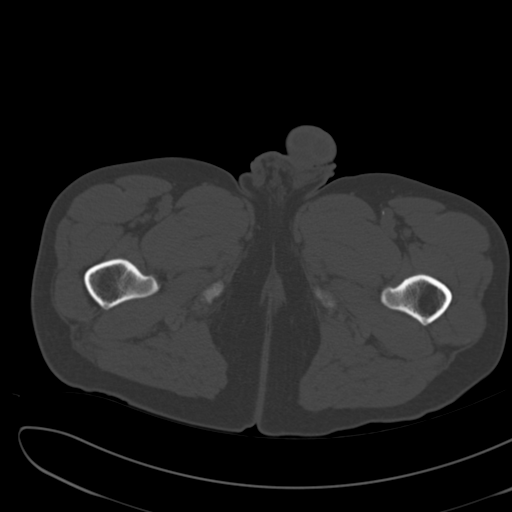
[im 17/139  soft-tissue]
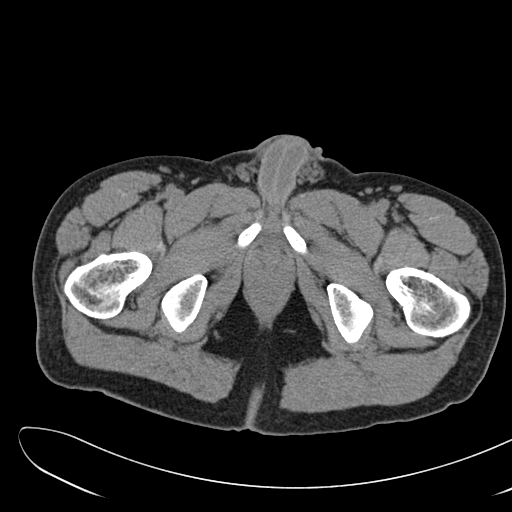
[im 28/139  soft-tissue]
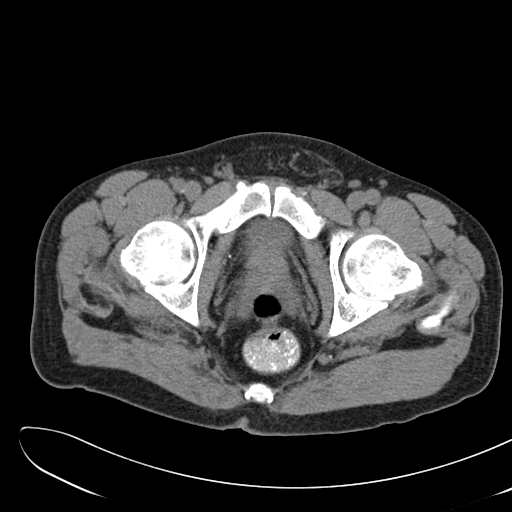
[im 39/139  soft-tissue]
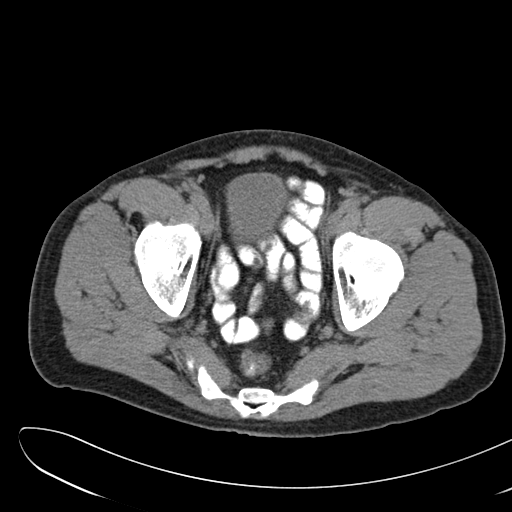
[im 50/139  soft-tissue]
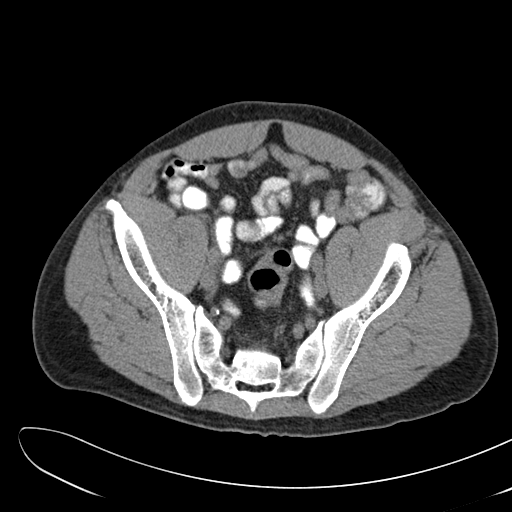
[im 61/139  soft-tissue]
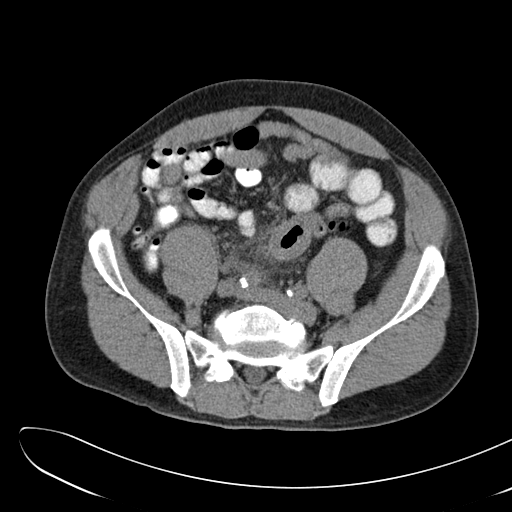
[im 72/139  soft-tissue]
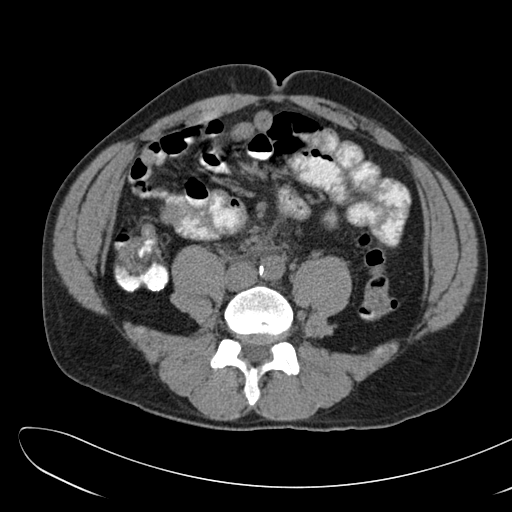
[im 78/139  soft-tissue]
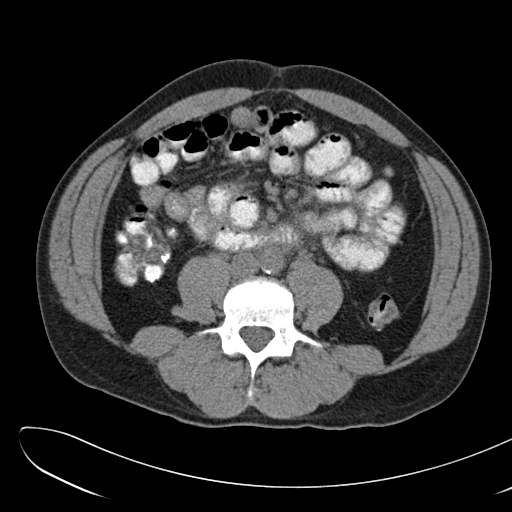
[im 89/139  soft-tissue]
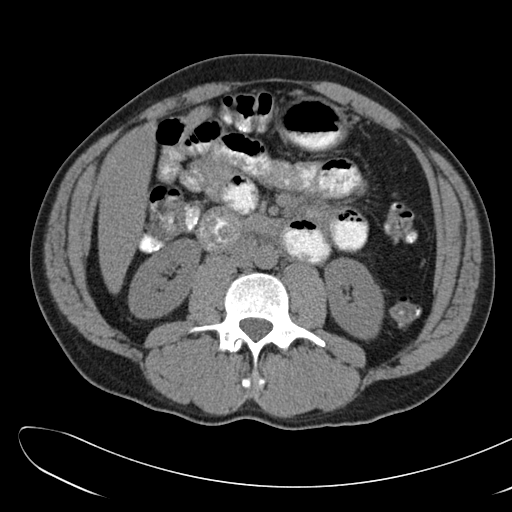
[im 89/139  bone]
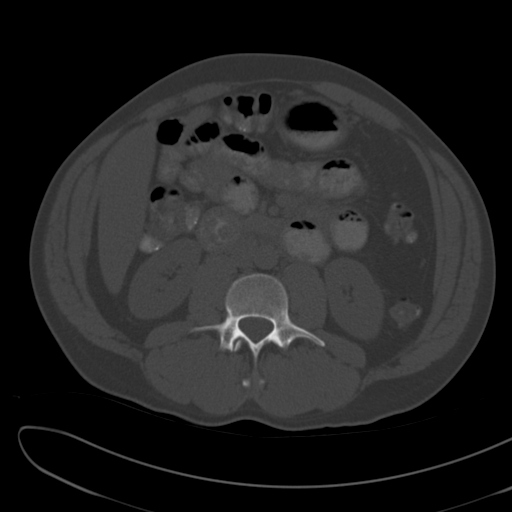
[im 100/139  soft-tissue]
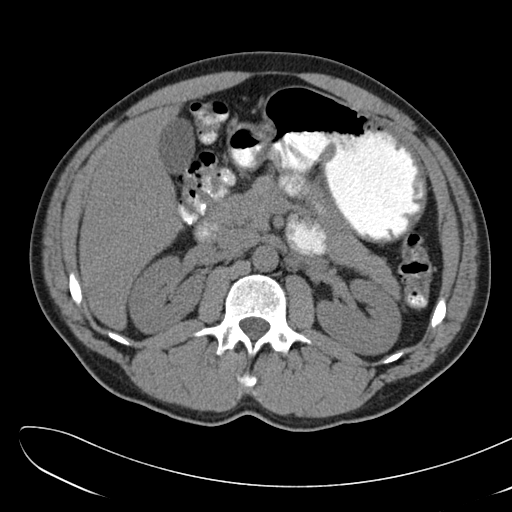
[im 111/139  soft-tissue]
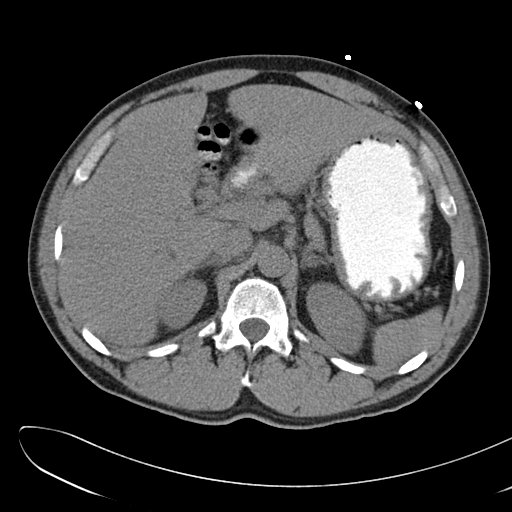
[im 116/139  lung]
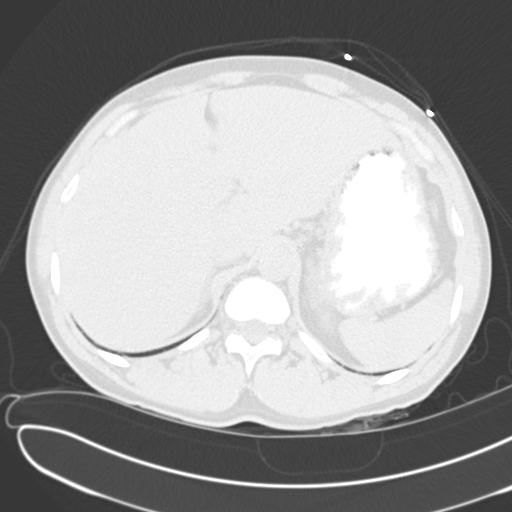
[im 122/139  soft-tissue]
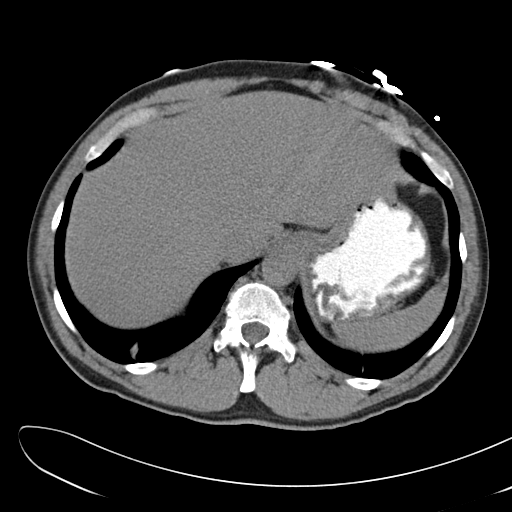
[im 122/139  lung]
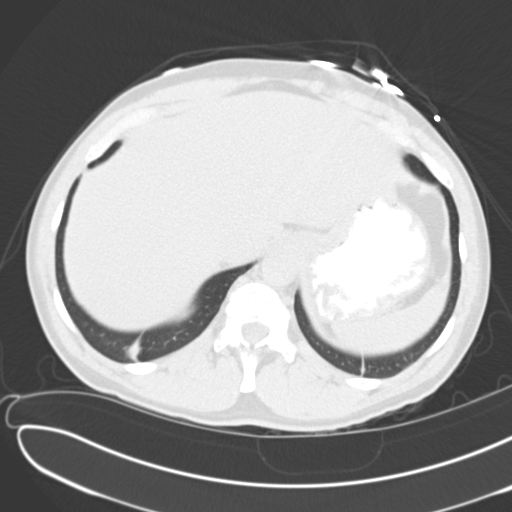
[im 127/139  lung]
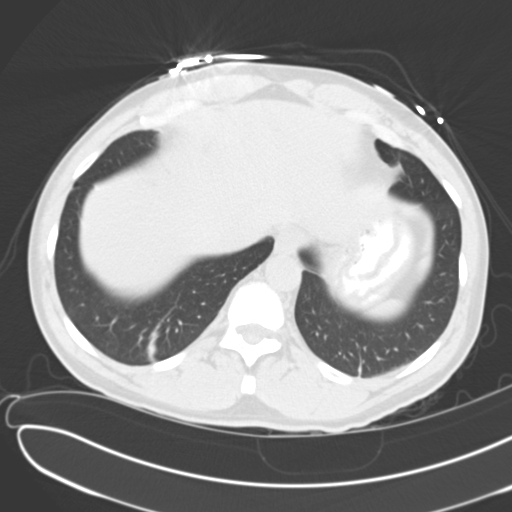
[im 133/139  soft-tissue]
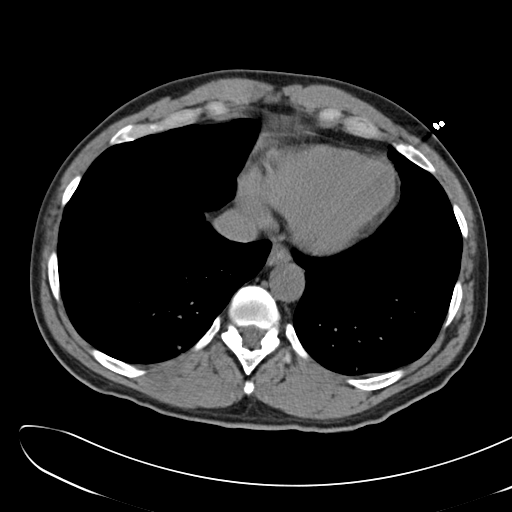
[im 133/139  lung]
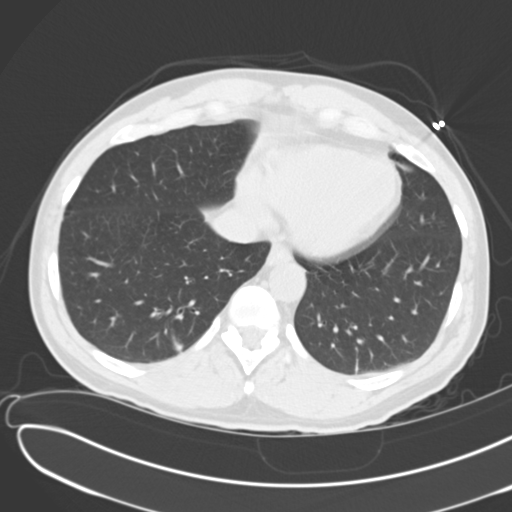

[15 of 32 positions shown; findings below may reference images not displayed]

FINDINGS: Mild basilar linear opacities likely represent atelectasis.

Lack of intravenous contrast limits evaluation of the solid abdominal
organs.  Small low-attenuation foci in the liver are too small to
characterize. The gallbladder, spleen, and pancreas are unremarkable. There
is mild thickening of the adrenal glands, without discrete mass.

No renal calculi or hydronephrosis. No ureterectasis.

There is mild diverticulosis of the sigmoid colon. There is focal bowel wall
thickening of the sigmoid colon. There is mild to moderate adjacent
inflammatory stranding. There are a few small foci of extraluminal air,
concerning for microperforation. No discrete fluid collection identified.
The appendix is normal.

No aggressive lytic or sclerotic osseous lesions identified.
IMPRESSION: Acute diverticulitis of the sigmoid colon. There are a few small foci of
extraluminal air concerning for microperforation. No discrete fluid
collection.

Followup with colonoscopy is recommended after the acute episode, as
malignancy can have a similar appearance.

## 2011-03-11 ENCOUNTER — Emergency Department: Payer: Self-pay | Admitting: Emergency Medicine

## 2011-05-09 ENCOUNTER — Ambulatory Visit: Payer: Self-pay | Admitting: Gastroenterology

## 2011-05-10 LAB — PATHOLOGY REPORT

## 2013-11-08 ENCOUNTER — Emergency Department: Payer: Self-pay | Admitting: Emergency Medicine

## 2013-11-08 LAB — URINALYSIS, COMPLETE
Bacteria: NONE SEEN
Hyaline Cast: 3
SQUAMOUS EPITHELIAL: NONE SEEN
Specific Gravity: 1.02 (ref 1.003–1.030)

## 2015-08-30 ENCOUNTER — Emergency Department: Payer: Self-pay

## 2015-08-30 ENCOUNTER — Encounter: Payer: Self-pay | Admitting: Medical Oncology

## 2015-08-30 ENCOUNTER — Emergency Department
Admission: EM | Admit: 2015-08-30 | Discharge: 2015-08-30 | Disposition: A | Discharge status: Home / Self Care | Payer: Self-pay | Attending: Emergency Medicine | Admitting: Emergency Medicine

## 2015-08-30 DIAGNOSIS — F1210 Cannabis abuse, uncomplicated: Secondary | ICD-10-CM | POA: Insufficient documentation

## 2015-08-30 DIAGNOSIS — R55 Syncope and collapse: Secondary | ICD-10-CM | POA: Insufficient documentation

## 2015-08-30 DIAGNOSIS — F17200 Nicotine dependence, unspecified, uncomplicated: Secondary | ICD-10-CM | POA: Insufficient documentation

## 2015-08-30 DIAGNOSIS — I10 Essential (primary) hypertension: Secondary | ICD-10-CM | POA: Insufficient documentation

## 2015-08-30 DIAGNOSIS — R079 Chest pain, unspecified: Secondary | ICD-10-CM | POA: Insufficient documentation

## 2015-08-30 DIAGNOSIS — R002 Palpitations: Secondary | ICD-10-CM | POA: Insufficient documentation

## 2015-08-30 HISTORY — DX: Male erectile dysfunction, unspecified: N52.9

## 2015-08-30 HISTORY — DX: Sciatica, unspecified side: M54.30

## 2015-08-30 HISTORY — DX: Hypercalcemia: E83.52

## 2015-08-30 HISTORY — DX: Restless legs syndrome: G25.81

## 2015-08-30 HISTORY — DX: Essential (primary) hypertension: I10

## 2015-08-30 LAB — CBC
HCT: 46.6 % (ref 40.0–52.0)
Hemoglobin: 15.8 g/dL (ref 13.0–18.0)
MCH: 33.5 pg (ref 26.0–34.0)
MCHC: 33.9 g/dL (ref 32.0–36.0)
MCV: 98.8 fL (ref 80.0–100.0)
PLATELETS: 212 10*3/uL (ref 150–440)
RBC: 4.71 MIL/uL (ref 4.40–5.90)
RDW: 13 % (ref 11.5–14.5)
WBC: 7.3 10*3/uL (ref 3.8–10.6)

## 2015-08-30 LAB — BASIC METABOLIC PANEL
ANION GAP: 6 (ref 5–15)
BUN: 17 mg/dL (ref 6–20)
CALCIUM: 9.6 mg/dL (ref 8.9–10.3)
CHLORIDE: 108 mmol/L (ref 101–111)
CO2: 24 mmol/L (ref 22–32)
CREATININE: 0.97 mg/dL (ref 0.61–1.24)
GFR calc Af Amer: >60 mL/min (ref >60)
GFR calc non Af Amer: >60 mL/min (ref >60)
Glucose, Bld: 103 mg/dL — ABNORMAL HIGH (ref 65–99)
Potassium: 4.1 mmol/L (ref 3.5–5.1)
Sodium: 138 mmol/L (ref 135–145)

## 2015-08-30 LAB — TROPONIN I: Troponin I: <0.03 ng/mL (ref <0.031)

## 2015-08-30 IMAGING — CR DG CHEST 2V
2 series · 2 of 2 positions shown · non-contrast
Comparison: PA and lateral chest 05/27/2010.

CLINICAL DATA: Chest tightness, tachycardia and shortness of breath
for 2 weeks. Initial encounter.

EXAM:
CHEST  2 VIEW

[chest pa]
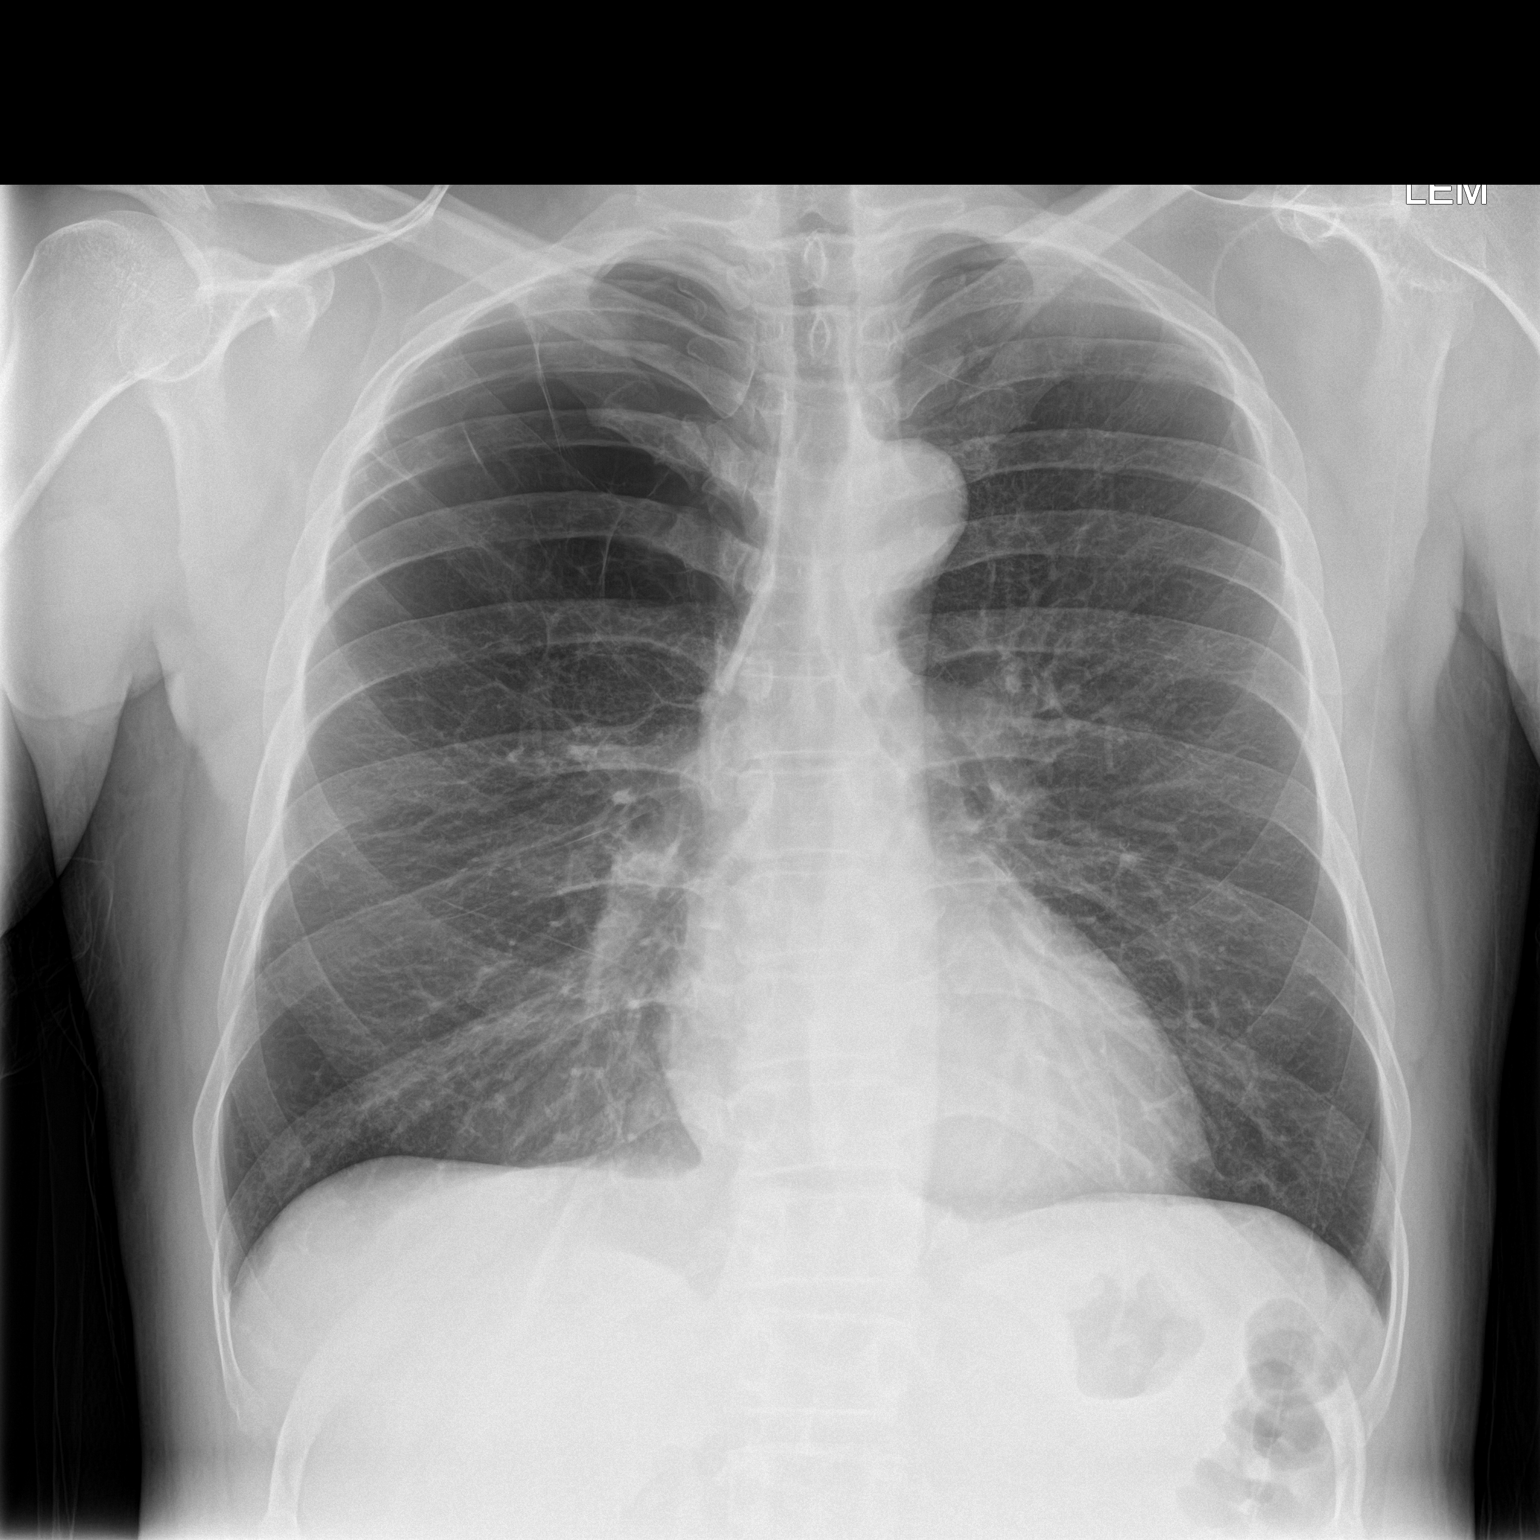

[chest lat]
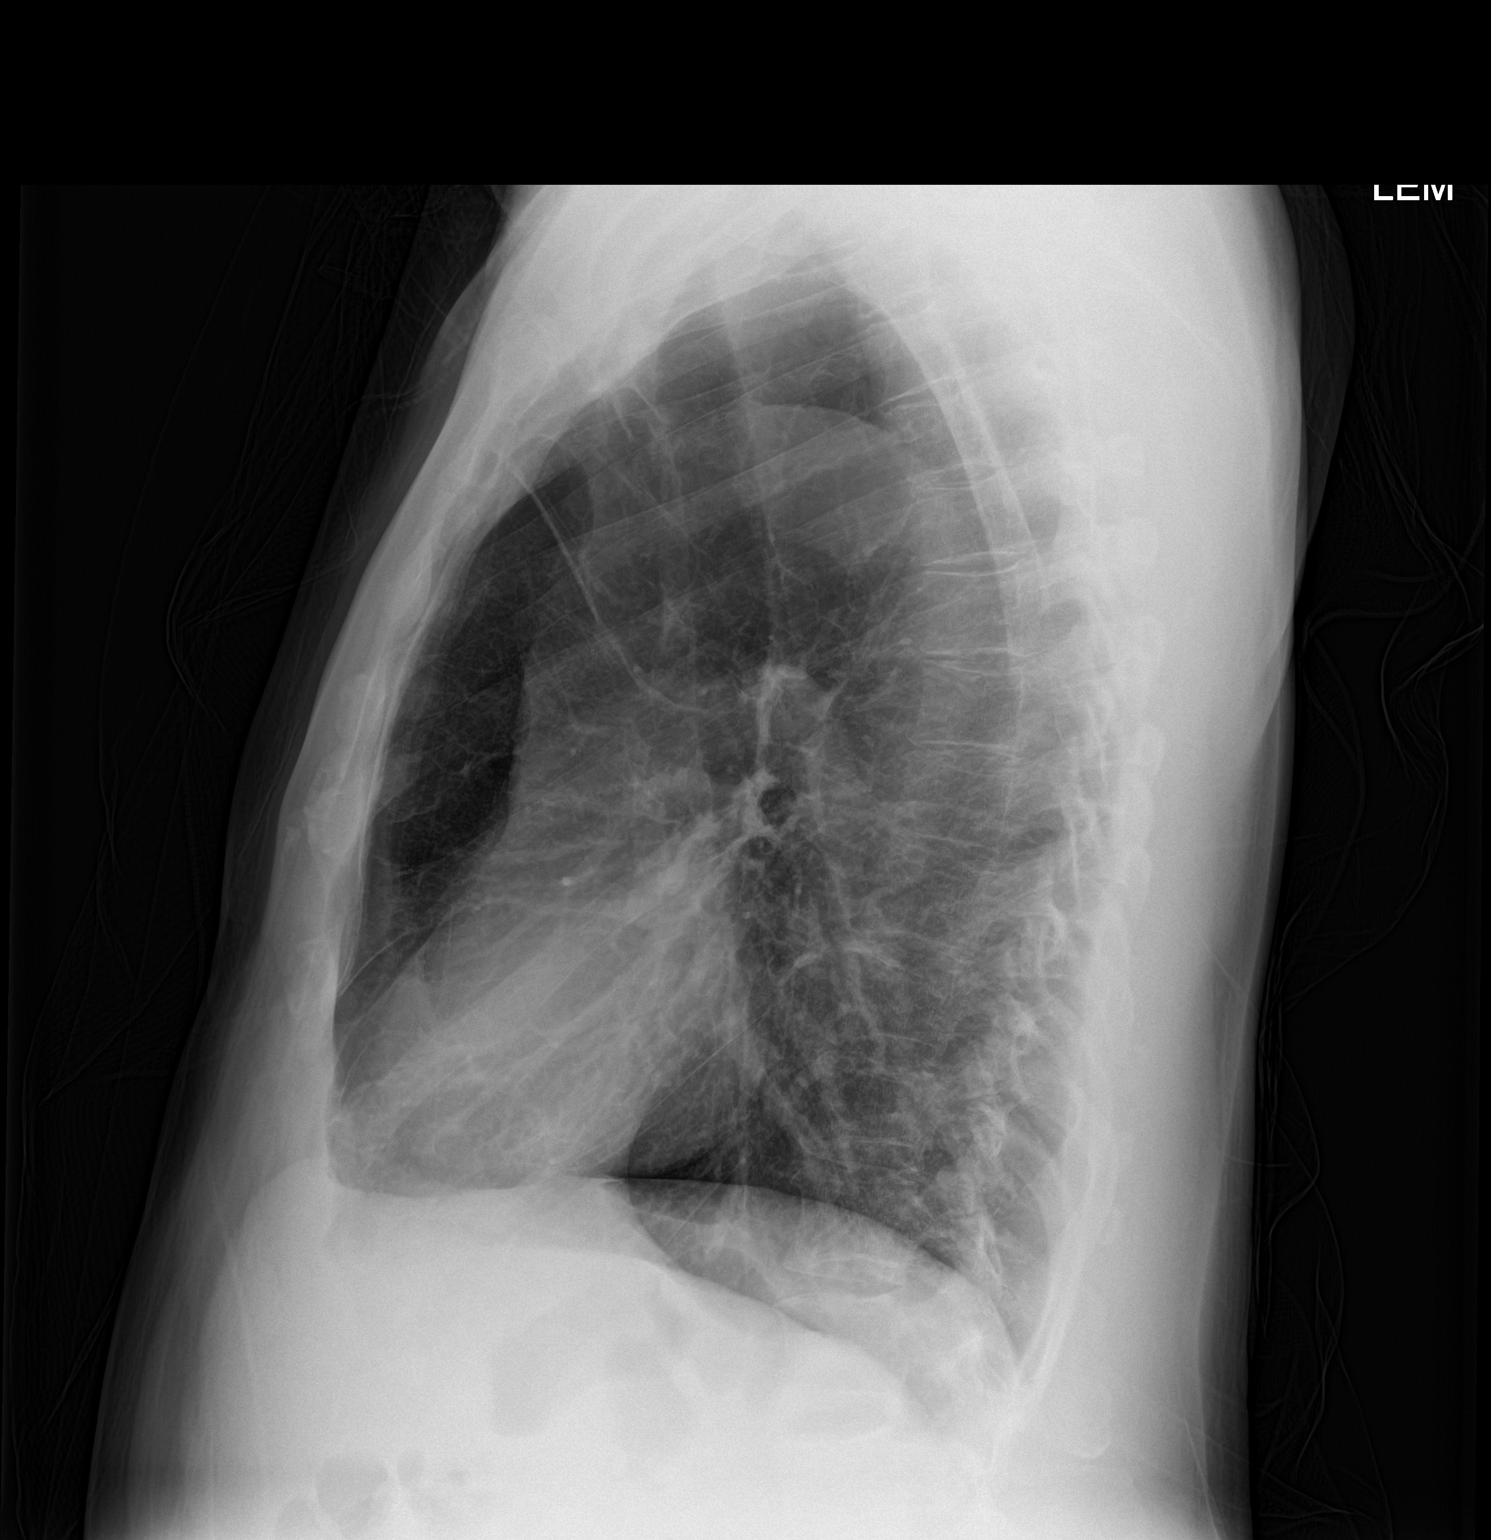

[2 of 2 positions shown; findings below may reference images not displayed]

FINDINGS: Changes of bullous emphysema are again seen. No consolidative
process, pneumothorax or effusion. Heart size is normal. No focal
bony abnormality.
IMPRESSION: Bullous emphysema without acute disease.

## 2015-08-30 NOTE — ED Provider Notes (Signed)
[facility] Emergency Department Provider Note  ____________________________________________  Time seen: Approximately 9811 PM  I have reviewed the triage vital signs and the nursing notes.   HISTORY  Chief Complaint Chest Pain and Palpitations    HPI Arthur Freeman is a 55 y.o. male with a history of hypertension who [facility] with chest pain and palpitations. He says that the chest pain has been ongoing for about 3 weeks now and is to the left side of his chest and nonradiating. He says it is constant and dull. 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 There is no worsening with deep breathing. He has no history of heart disease. He does smoke cigarettes and marijuana daily. He also drinks a 40 ounce beer every day. He says that he has been having five-minute episodes of palpitations over the past 2 weeks. He says that most recently he felt like he was going to pass out. This was last night when he had an episode at work. When [facility] to the emergency department. The patient says that he has been compliant with his losartan but has not been taking his 25 mg of HCTZ. He said that they [facility] a [facility]Z and he will be able to pick it up there and start taking it immediately. He says that his heart palpitations feel regular like "someone is scaring you." I tried to describe irregular rhythm to him and he says that it feels more regular.Denies any palpitations at this time because he does feel the left-sided dull chest pain.   Past Medical History  Diagnosis Date  . Hypercalcemia   . RLS (restless legs syndrome)   . ED (erectile dysfunction)   . Hypertension   . Sciatica     There are no active problems to display  for this patient.   History reviewed. No pertinent past surgical history.  No current outpatient prescriptions on file.  Allergies Lisinopril and Norvasc  No family history on file.  Social History Social History  Substance Use Topics  . Smoking status: Current Every Day Smoker  . Smokeless tobacco: None  . Alcohol Use: Yes    Review of Systems Constitutional: No fever/chills Eyes: No visual changes. ENT: No sore throat. Cardiovascular:As above Respiratory: Patient says that he has shortness of breath with the palpitations. Gastrointestinal: No abdominal pain.  No nausea, no vomiting.  No diarrhea.  No constipation. Genitourinary: Negative for dysuria. Musculoskeletal: Negative for back pain. Skin: Negative for rash. Neurological: Negative for headaches, focal weakness or numbness.  10-point ROS otherwise negative.  ____________________________________________   PHYSICAL EXAM:  VITAL SIGNS: ED Triage Vitals  Enc Vitals Group     BP 08/30/15 1047 162/113 mmHg     Pulse Rate 08/30/15 1047 71     Resp 08/30/15 1047 20     Temp 08/30/15 1047 97.5 F (36.4 C)     Temp Source 08/30/15 1047 Oral     SpO2 08/30/15 1047 96 %     Weight 08/30/15 1047 166 lb (75.297 kg)     Height 08/30/15 1047 '5\' 11"'$  (1.803 m)     Head Cir --      Peak Flow --      Pain  Score 08/30/15 1047 6     Pain Loc --      Pain Edu? --      Excl. in [place]? --     Constitutional: Alert and oriented. Well appearing and in no acute distress. Eyes: Conjunctivae are normal. PERRL. EOMI. Head: Atraumatic. Nose: No congestion/rhinnorhea. Mouth/Throat: Mucous membranes are moist.   Neck: No stridor.   Cardiovascular: Normal rate, regular rhythm. Grossly normal heart sounds.  Good peripheral circulation With equal bilateral radial pulses. The left-sided chest pain is reproducible just lateral to the pectoralis major. Respiratory: Normal respiratory effort.  No retractions. Lungs  CTAB. Gastrointestinal: Soft and nontender. No distention. No CVA tenderness. Musculoskeletal: No lower extremity tenderness nor edema.  No joint effusions. Neurologic:  Normal speech and language. No gross focal neurologic deficits are appreciated.  Skin:  Skin is warm, dry and intact. No rash noted. Psychiatric: Mood and affect are normal. Speech and behavior are normal.  ____________________________________________   LABS (all labs ordered are listed, but only abnormal results are displayed)  Labs Reviewed  BASIC METABOLIC PANEL - Abnormal; Notable for the following:    Glucose, Bld 103 (*)    All other components within normal limits  CBC  TROPONIN I   ____________________________________________  EKG  ED ECG REPORT I, Doran Stabler, the attending physician, personally viewed and interpreted this ECG.   Date: 08/30/2015  EKG Time: 1049  Rate: 78  Rhythm: normal sinus rhythm  Axis: Normal  Intervals:none  ST&T Change: ST elevation in V2 and V3 of 1 mm. No ST depression. No abnormal T-wave inversion.No gait change in EKG from 03/11/2011.  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   Electronically Signed By: Inge Rise M.D. On: 08/30/2015 11:18 ____________________________________________   PROCEDURES ____________________________________________   INITIAL IMPRESSION / ASSESSMENT AND PLAN / ED COURSE  Pertinent labs & imaging results that were available during my care of the patient were reviewed by me and considered in my medical decision making (see chart for details).  ----------------------------------------- 1:11 PM on 08/30/2015 -----------------------------------------  I discussed the case Dr. Rockey Situ of cardiology who recommends the patient be followed up in office. He will pass on the information to the office scheduler so the patient will receive a phone call to  schedule an urgent follow-up appointment. Dr. Rockey Situ said the patient should receive a call either this afternoon or tomorrow morning to schedule follow-up. I discussed the lab results as well as the plan with the patient as well as wife was at the bedside. He is understanding of plan and willing to comply. He understands that he missed follow-up with cardiologist within the next several days. He understands that he must return to the emergency department immediately for any worsening or concerning symptoms. His chest pain feels more musculoskeletal and is unchanged EKG and normal troponin support this. It is unclear what exactly the arrhythmia is at this time. His EKG does not show an arrhythmia. Possible SVT versus A. fib. ____________________________________________   FINAL CLINICAL IMPRESSION(S) / ED DIAGNOSES  Chest pain. Palpitations. Near-syncope.    Orbie Pyo, MD 08/30/15 [phone]

## 2015-08-30 NOTE — ED Notes (Signed)
Pt reports he has been having central chest discomfort off and on for over a week and at times he feels like his heart is racing.

## 2015-08-31 ENCOUNTER — Telehealth: Payer: Self-pay | Admitting: Cardiology

## 2015-08-31 NOTE — Telephone Encounter (Signed)
Spoke to patients wife regarding her husbands recent hospital visit for chest pain. She stated that they had no questions at this time. Verified his upcoming appointment with [place] on 09/14/15 at 1:00PM. Let her know to call us back if he had any questions or problems.

## 2015-08-31 NOTE — Telephone Encounter (Signed)
Spoke to patient, he was seen in ED 08/30/15 for chest pain and Palpitations  Pt is coming 09/14/15 to see Dr Yvone Neu.

## 2015-09-14 ENCOUNTER — Ambulatory Visit: Payer: Self-pay | Admitting: Cardiology

## 2015-10-02 ENCOUNTER — Encounter: Payer: Self-pay | Admitting: *Deleted

## 2015-10-02 ENCOUNTER — Ambulatory Visit: Payer: Self-pay | Admitting: Cardiology

## 2017-09-29 ENCOUNTER — Emergency Department
Admission: EM | Admit: 2017-09-29 | Discharge: 2017-09-29 | Disposition: A | Discharge status: Home / Self Care | Payer: Self-pay | Attending: Emergency Medicine | Admitting: Emergency Medicine

## 2017-09-29 ENCOUNTER — Encounter: Payer: Self-pay | Admitting: Emergency Medicine

## 2017-09-29 DIAGNOSIS — I10 Essential (primary) hypertension: Secondary | ICD-10-CM | POA: Insufficient documentation

## 2017-09-29 DIAGNOSIS — K2900 Acute gastritis without bleeding: Secondary | ICD-10-CM | POA: Insufficient documentation

## 2017-09-29 LAB — URINALYSIS, COMPLETE (UACMP) WITH MICROSCOPIC
BILIRUBIN URINE: NEGATIVE
GLUCOSE, UA: NEGATIVE mg/dL
HGB URINE DIPSTICK: NEGATIVE
Ketones, ur: NEGATIVE mg/dL
Leukocytes, UA: NEGATIVE
NITRITE: NEGATIVE
PH: 5 (ref 5.0–8.0)
Protein, ur: NEGATIVE mg/dL
SPECIFIC GRAVITY, URINE: 1.017 (ref 1.005–1.030)

## 2017-09-29 LAB — COMPREHENSIVE METABOLIC PANEL
ALBUMIN: 4.6 g/dL (ref 3.5–5.0)
ALK PHOS: 95 U/L (ref 38–126)
ALT: 39 U/L (ref 17–63)
ANION GAP: 8 (ref 5–15)
AST: 39 U/L (ref 15–41)
BILIRUBIN TOTAL: 0.6 mg/dL (ref 0.3–1.2)
BUN: 17 mg/dL (ref 6–20)
CALCIUM: 9.6 mg/dL (ref 8.9–10.3)
CO2: 26 mmol/L (ref 22–32)
CREATININE: 0.86 mg/dL (ref 0.61–1.24)
Chloride: 105 mmol/L (ref 101–111)
GFR calc Af Amer: >60 mL/min (ref >60)
GFR calc non Af Amer: >60 mL/min (ref >60)
GLUCOSE: 119 mg/dL — AB (ref 65–99)
Potassium: 3.7 mmol/L (ref 3.5–5.1)
Sodium: 139 mmol/L (ref 135–145)
TOTAL PROTEIN: 8.1 g/dL (ref 6.5–8.1)

## 2017-09-29 LAB — CBC
HCT: 47.6 % (ref 40.0–52.0)
HEMOGLOBIN: 16.1 g/dL (ref 13.0–18.0)
MCH: 33.7 pg (ref 26.0–34.0)
MCHC: 33.8 g/dL (ref 32.0–36.0)
MCV: 99.5 fL (ref 80.0–100.0)
PLATELETS: 304 10*3/uL (ref 150–440)
RBC: 4.78 MIL/uL (ref 4.40–5.90)
RDW: 13.4 % (ref 11.5–14.5)
WBC: 9.1 10*3/uL (ref 3.8–10.6)

## 2017-09-29 LAB — TYPE AND SCREEN
ABO/RH(D): O POS
Antibody Screen: NEGATIVE

## 2017-09-29 LAB — LIPASE, BLOOD: Lipase: 73 U/L — ABNORMAL HIGH (ref 11–51)

## 2017-09-29 MED ORDER — OMEPRAZOLE 40 MG PO CPDR: 40.0000 mg | DELAYED_RELEASE_CAPSULE | Freq: Every day | ORAL | 1 refills | Status: DC

## 2017-09-29 MED ORDER — GI COCKTAIL ~~LOC~~
30.0000 mL | Freq: Once | ORAL | Status: AC
  Administered 2017-09-29: 30 mL via ORAL
  Filled 2017-09-29: qty 30

## 2017-09-29 NOTE — ED Provider Notes (Signed)
[facility] Emergency Department Provider Note   ____________________________________________    I have reviewed the triage vital signs and the nursing notes.   HISTORY  Chief Complaint Abdominal Pain     HPI Arthur Freeman is a 57 y.o. male who presents with complaints of abdominal pain.  Patient reports a "gnawing sensation in his abdomen "which he states is not really overly painful but uncomfortable and has been occurring for over a week.  He denies nausea or vomiting.  He does admit to drinking alcohol daily.  He reports he has had 2 episodes of bright red blood on the tissue after having a bowel movement in the last week, none today.  No dizziness.  Past Medical History:  Diagnosis Date  . ED (erectile dysfunction)   . Hypercalcemia   . Hypertension   . RLS (restless legs syndrome)   . Sciatica     There are no active problems to display for this patient.   No past surgical history on file.  Prior to Admission medications   Medication Sig Start Date End Date Taking? Authorizing Provider  omeprazole (PRILOSEC) 40 MG capsule Take 1 capsule (40 mg total) by mouth daily. 09/29/17 09/29/18  Lavonia Drafts, MD     Allergies Lisinopril and Norvasc [amlodipine besylate]  No family history on file.  Social History Social History   Tobacco Use  . Smoking status: Current Every Day Smoker  Substance Use Topics  . Alcohol use: Yes  . Drug use: Yes    Types: Marijuana    Review of Systems  Constitutional: No fever/chills Eyes: No visual changes.  ENT: No sore throat. Cardiovascular: Denies chest pain. Respiratory: Denies shortness of breath. Gastrointestinal: As above Genitourinary: Negative for dysuria. Musculoskeletal: Negative for back pain. Skin: Negative for rash. Neurological: Negative for headaches or weakness   ____________________________________________   PHYSICAL EXAM:  VITAL SIGNS: ED Triage Vitals  Enc  Vitals Group     BP 09/29/17 0936 (!) 173/107     Pulse Rate 09/29/17 0936 87     Resp 09/29/17 0936 20     Temp 09/29/17 0936 98.2 F (36.8 C)     Temp Source 09/29/17 0936 Oral     SpO2 09/29/17 0936 98 %     Weight 09/29/17 0937 78.5 kg (173 lb)     Height 09/29/17 0937 1.803 m (5\' 11" )     Head Circumference --      Peak Flow --      Pain Score 09/29/17 0949 6     Pain Loc --      Pain Edu? --      Excl. in [place]? --     Constitutional: Alert and oriented. No acute distress. Pleasant and interactive Eyes: Conjunctivae are normal.   Nose: No congestion/rhinnorhea. Mouth/Throat: Mucous membranes are moist.   Neck:  Painless ROM Cardiovascular: Normal rate, regular rhythm. Grossly normal heart sounds.  Good peripheral circulation. Respiratory: Normal respiratory effort.  No retractions. Lungs CTAB. Gastrointestinal: Soft and nontender. No distention.  No CVA tenderness.  No hemorrhoids on rectal exam Genitourinary: deferred Musculoskeletal: No lower extremity tenderness nor edema.  Warm and well perfused Neurologic:  Normal speech and language. No gross focal neurologic deficits are appreciated.  Skin:  Skin is warm, dry and intact. No rash noted. Psychiatric: Mood and affect are normal. Speech and behavior are normal.  ____________________________________________   LABS (all labs ordered are listed, but only abnormal results are displayed)  Labs Reviewed  LIPASE, BLOOD - Abnormal; Notable for the following components:      Result Value   Lipase 73 (*)    All other components within normal limits  COMPREHENSIVE METABOLIC PANEL - Abnormal; Notable for the following components:   Glucose, Bld 119 (*)    All other components within normal limits  URINALYSIS, COMPLETE (UACMP) WITH MICROSCOPIC - Abnormal; Notable for the following components:   Color, Urine YELLOW (*)    APPearance CLEAR (*)    Squamous Epithelial / LPF 0-5 (*)    All other components within normal limits    CBC  TYPE AND SCREEN   ____________________________________________  EKG  None ____________________________________________  RADIOLOGY  None ____________________________________________   PROCEDURES  Procedure(s) performed: No  Procedures   Critical Care performed: No ____________________________________________   INITIAL IMPRESSION / ASSESSMENT AND PLAN / ED COURSE  Pertinent labs & imaging results that were available during my care of the patient were reviewed by me and considered in my medical decision making (see chart for details).  Patient well-appearing in no acute distress.  Lab work is reassuring, hemoglobin is normal.  Suspect internal hemorrhoid as the cause of the blood that he is seen on toilet paper.  Symptoms are most consistent with gastritis, suspect related to alcohol.  Patient treated with GI cocktail with complete resolution of symptoms.  Given this will put him on a PPI and have him follow-up as an outpatient, discussed alcohol cessation with him    ____________________________________________   FINAL CLINICAL IMPRESSION(S) / ED DIAGNOSES  Final diagnoses:  Acute gastritis without hemorrhage, unspecified gastritis type        Note:  This document was prepared using Dragon voice recognition software and may include unintentional dictation errors.    Lavonia Drafts, MD 09/29/17 [phone]

## 2017-09-29 NOTE — ED Triage Notes (Addendum)
Pt reports lower abdominal pain for a few weeks, reports mucous and blood in stool. Reports frank blood in stool, denies hemorrhoids. Pt reports getting blood work done at [facility] and was told he has Hepatitis B.

## 2019-01-16 ENCOUNTER — Other Ambulatory Visit: Payer: Self-pay

## 2019-01-16 ENCOUNTER — Emergency Department
Admission: EM | Admit: 2019-01-16 | Discharge: 2019-01-16 | Disposition: A | Discharge status: Home / Self Care | Payer: Self-pay | Attending: Student | Admitting: Student

## 2019-01-16 ENCOUNTER — Encounter: Payer: Self-pay | Admitting: Emergency Medicine

## 2019-01-16 DIAGNOSIS — Z5321 Procedure and treatment not carried out due to patient leaving prior to being seen by health care provider: Secondary | ICD-10-CM | POA: Insufficient documentation

## 2019-01-16 DIAGNOSIS — R0602 Shortness of breath: Secondary | ICD-10-CM | POA: Insufficient documentation

## 2019-01-16 LAB — CBC
HCT: 43.8 % (ref 39.0–52.0)
Hemoglobin: 15.2 g/dL (ref 13.0–17.0)
MCH: 34.2 pg — ABNORMAL HIGH (ref 26.0–34.0)
MCHC: 34.7 g/dL (ref 30.0–36.0)
MCV: 98.6 fL (ref 80.0–100.0)
Platelets: 239 10*3/uL (ref 150–400)
RBC: 4.44 MIL/uL (ref 4.22–5.81)
RDW: 12 % (ref 11.5–15.5)
WBC: 5.2 10*3/uL (ref 4.0–10.5)
nRBC: 0 % (ref 0.0–0.2)

## 2019-01-16 LAB — BASIC METABOLIC PANEL
Anion gap: 9 (ref 5–15)
BUN: 23 mg/dL — ABNORMAL HIGH (ref 6–20)
CO2: 24 mmol/L (ref 22–32)
Calcium: 9.4 mg/dL (ref 8.9–10.3)
Chloride: 104 mmol/L (ref 98–111)
Creatinine, Ser: 0.94 mg/dL (ref 0.61–1.24)
GFR calc Af Amer: >60 mL/min (ref >60)
GFR calc non Af Amer: >60 mL/min (ref >60)
Glucose, Bld: 98 mg/dL (ref 70–99)
Potassium: 3.7 mmol/L (ref 3.5–5.1)
Sodium: 137 mmol/L (ref 135–145)

## 2019-01-16 NOTE — ED Triage Notes (Signed)
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  Pt states his sxs are getting better, denies any chest pain.

## 2019-01-19 ENCOUNTER — Telehealth: Payer: Self-pay | Admitting: Emergency Medicine

## 2019-01-19 NOTE — Telephone Encounter (Signed)
Called patient due to lwot to inquire about condition and follow up plans. Message with fam member

## 2019-09-05 ENCOUNTER — Ambulatory Visit: Payer: Self-pay | Attending: Internal Medicine

## 2019-09-05 DIAGNOSIS — Z23 Encounter for immunization: Secondary | ICD-10-CM

## 2019-09-05 NOTE — Progress Notes (Signed)
   Covid-19 Vaccination Clinic  Name:  Arthur Freeman    MRN: [id number] DOB: 1961-05-30  09/05/2019  Mr. [name] was observed post Covid-19 immunization for 15 minutes without incident. He was provided with Vaccine Information Sheet and instruction to access the V-Safe system.   Mr. [name] was instructed to call 911 with any severe reactions post vaccine: Marland Kitchen Difficulty breathing  . Swelling of face and throat  . A fast heartbeat  . A bad rash all over body  . Dizziness and weakness   Immunizations Administered    Name Date Dose VIS Date Route   Pfizer COVID-19 Vaccine 09/05/2019  9:10 AM 0.3 mL 05/21/2019 Intramuscular   Manufacturer: [employer], [employer]   Lot: [id number]   [place]: [id number]

## 2019-09-29 ENCOUNTER — Ambulatory Visit: Payer: Self-pay | Attending: Internal Medicine

## 2019-09-29 ENCOUNTER — Ambulatory Visit: Payer: Self-pay

## 2019-09-29 DIAGNOSIS — Z23 Encounter for immunization: Secondary | ICD-10-CM

## 2019-09-29 NOTE — Progress Notes (Signed)
   Covid-19 Vaccination Clinic  Name:  Arthur Freeman    MRN: [id number] DOB: [birth year]  09/29/2019  [name] was observed post Covid-19 immunization for 15 minutes without incident. He was provided with Vaccine Information Sheet and instruction to access the V-Safe system.   [name] was instructed to call [phone] with any severe reactions post vaccine: Marland Kitchen Difficulty breathing  . Swelling of face and throat  . A fast heartbeat  . A bad rash all over body  . Dizziness and weakness   Immunizations Administered    Name Date Dose VIS Date Route   Pfizer COVID-19 Vaccine 09/29/2019  9:14 AM 0.3 mL 08/04/2018 Intramuscular   Manufacturer: [place]   Lot: [id number]   [place]: [id number]

## 2020-06-21 ENCOUNTER — Other Ambulatory Visit: Payer: Self-pay

## 2020-06-21 DIAGNOSIS — Z20822 Contact with and (suspected) exposure to covid-19: Secondary | ICD-10-CM

## 2020-06-23 LAB — NOVEL CORONAVIRUS, NAA: SARS-CoV-2, NAA: NOT DETECTED

## 2020-06-23 LAB — SARS-COV-2, NAA 2 DAY TAT

## 2021-09-21 ENCOUNTER — Inpatient Hospital Stay
Admission: EM | Admit: 2021-09-21 | Discharge: 2021-09-22 | DRG: 181 | Disposition: A | Discharge status: Home / Self Care | Payer: Self-pay | Attending: Hospitalist | Admitting: Hospitalist

## 2021-09-21 ENCOUNTER — Encounter: Payer: Self-pay | Admitting: Emergency Medicine

## 2021-09-21 ENCOUNTER — Emergency Department: Payer: Self-pay

## 2021-09-21 DIAGNOSIS — R079 Chest pain, unspecified: Secondary | ICD-10-CM

## 2021-09-21 DIAGNOSIS — G2581 Restless legs syndrome: Secondary | ICD-10-CM | POA: Diagnosis present

## 2021-09-21 DIAGNOSIS — F17200 Nicotine dependence, unspecified, uncomplicated: Secondary | ICD-10-CM

## 2021-09-21 DIAGNOSIS — R042 Hemoptysis: Secondary | ICD-10-CM | POA: Diagnosis present

## 2021-09-21 DIAGNOSIS — F17210 Nicotine dependence, cigarettes, uncomplicated: Secondary | ICD-10-CM | POA: Diagnosis present

## 2021-09-21 DIAGNOSIS — I160 Hypertensive urgency: Secondary | ICD-10-CM

## 2021-09-21 DIAGNOSIS — Z597 Insufficient social insurance and welfare support: Secondary | ICD-10-CM

## 2021-09-21 DIAGNOSIS — I248 Other forms of acute ischemic heart disease: Secondary | ICD-10-CM | POA: Diagnosis present

## 2021-09-21 DIAGNOSIS — R64 Cachexia: Secondary | ICD-10-CM | POA: Diagnosis present

## 2021-09-21 DIAGNOSIS — R918 Other nonspecific abnormal finding of lung field: Secondary | ICD-10-CM

## 2021-09-21 DIAGNOSIS — J439 Emphysema, unspecified: Secondary | ICD-10-CM

## 2021-09-21 DIAGNOSIS — Z79899 Other long term (current) drug therapy: Secondary | ICD-10-CM

## 2021-09-21 DIAGNOSIS — R590 Localized enlarged lymph nodes: Secondary | ICD-10-CM | POA: Diagnosis present

## 2021-09-21 DIAGNOSIS — Z888 Allergy status to other drugs, medicaments and biological substances status: Secondary | ICD-10-CM

## 2021-09-21 DIAGNOSIS — C3431 Malignant neoplasm of lower lobe, right bronchus or lung: Principal | ICD-10-CM | POA: Diagnosis present

## 2021-09-21 DIAGNOSIS — I10 Essential (primary) hypertension: Secondary | ICD-10-CM | POA: Diagnosis present

## 2021-09-21 LAB — BASIC METABOLIC PANEL WITH GFR
Anion gap: 7 (ref 5–15)
BUN: 18 mg/dL (ref 8–23)
CO2: 29 mmol/L (ref 22–32)
Calcium: 10 mg/dL (ref 8.9–10.3)
Chloride: 102 mmol/L (ref 98–111)
Creatinine, Ser: 0.8 mg/dL (ref 0.61–1.24)
GFR, Estimated: >60 mL/min
Glucose, Bld: 98 mg/dL (ref 70–99)
Potassium: 3.3 mmol/L — ABNORMAL LOW (ref 3.5–5.1)
Sodium: 138 mmol/L (ref 135–145)

## 2021-09-21 LAB — CBC
HCT: 40.3 % (ref 39.0–52.0)
Hemoglobin: 12.7 g/dL — ABNORMAL LOW (ref 13.0–17.0)
MCH: 30 pg (ref 26.0–34.0)
MCHC: 31.5 g/dL (ref 30.0–36.0)
MCV: 95.3 fL (ref 80.0–100.0)
Platelets: 339 10*3/uL (ref 150–400)
RBC: 4.23 MIL/uL (ref 4.22–5.81)
RDW: 13.3 % (ref 11.5–15.5)
WBC: 9.4 10*3/uL (ref 4.0–10.5)
nRBC: 0 % (ref 0.0–0.2)

## 2021-09-21 LAB — TROPONIN I (HIGH SENSITIVITY): Troponin I (High Sensitivity): 25 ng/L — ABNORMAL HIGH

## 2021-09-21 IMAGING — CR DG CHEST 2V
2 series · 2 of 2 positions shown · non-contrast
Comparison: 08/30/2015

CLINICAL DATA: Chest pain

EXAM:
CHEST - 2 VIEW

[chest pa]
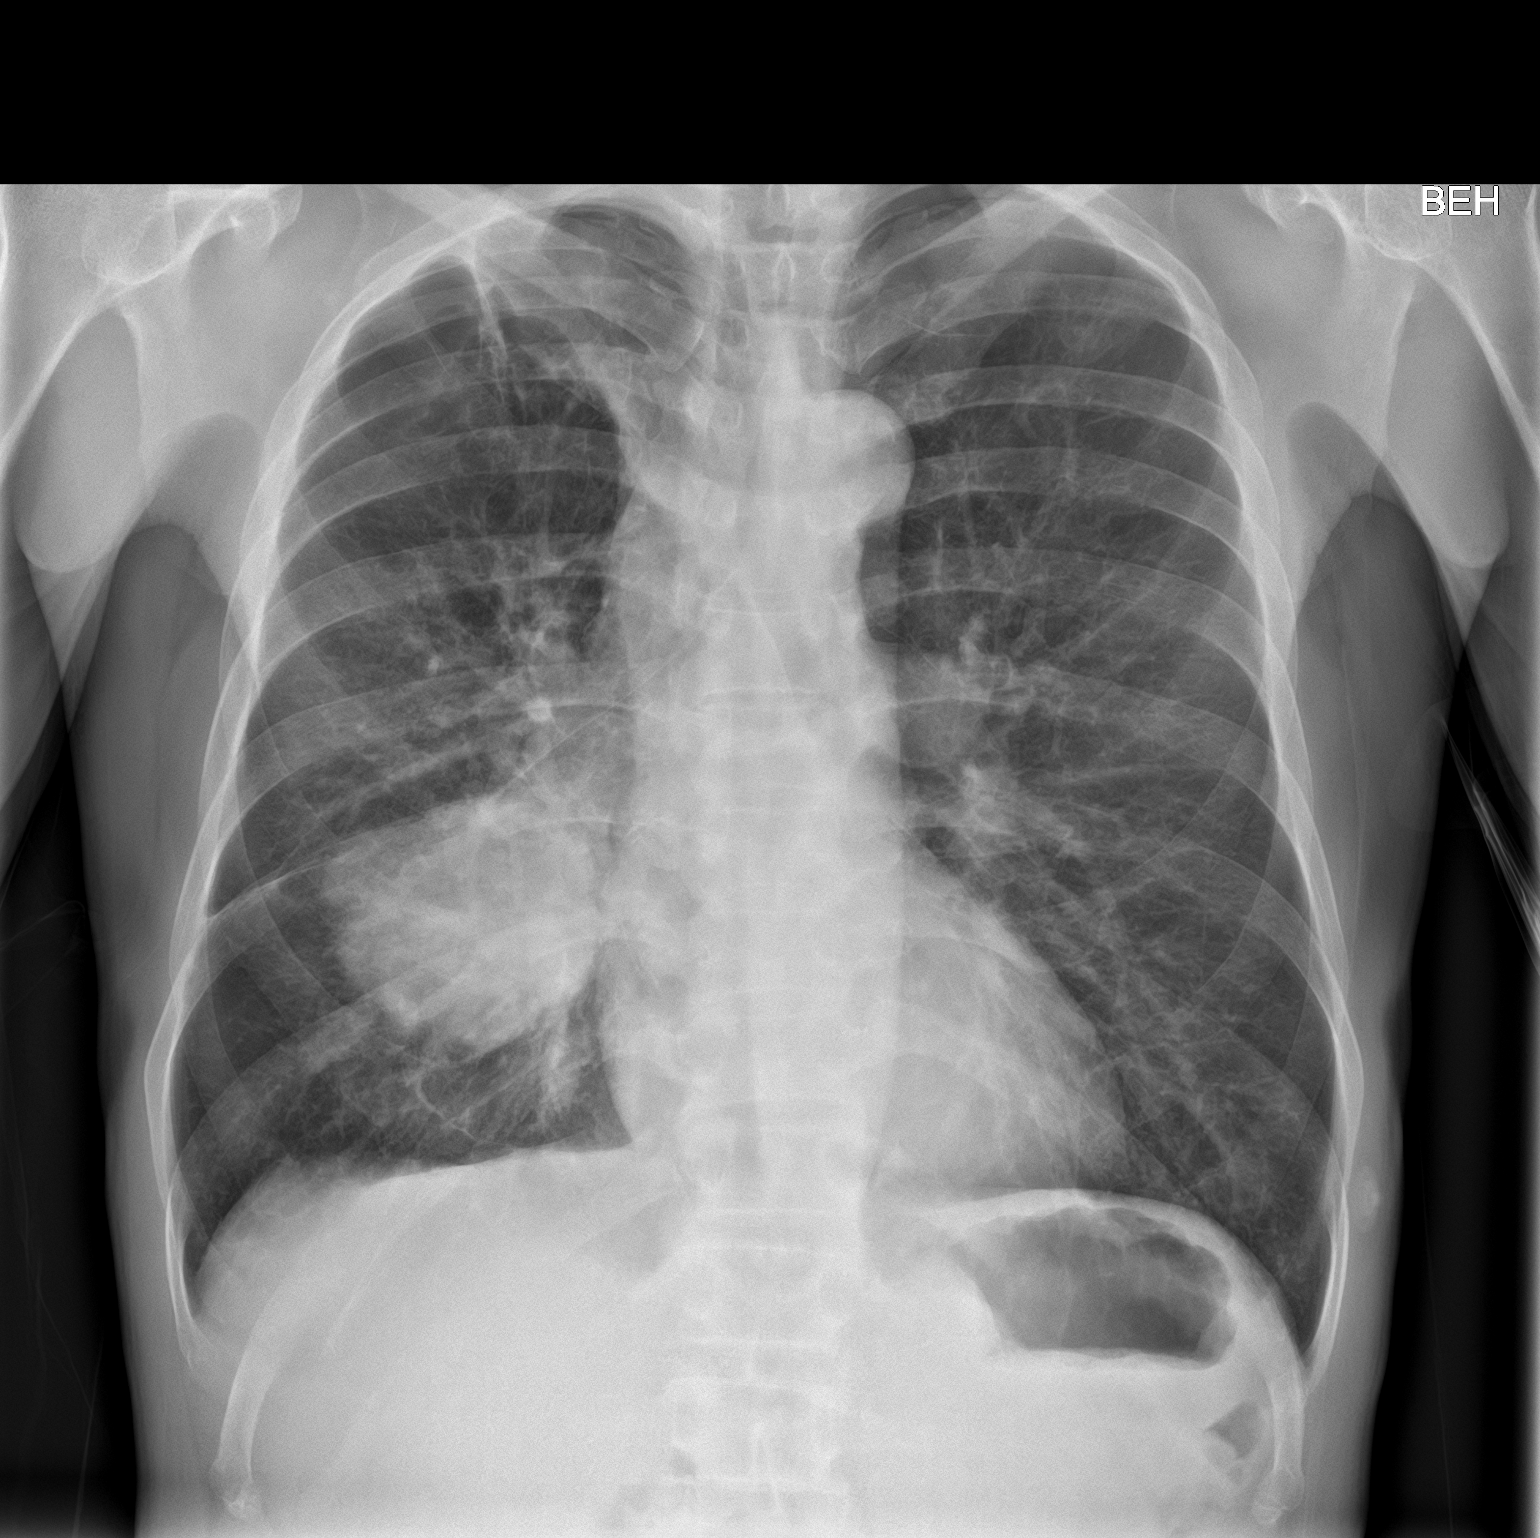

[chest lat]
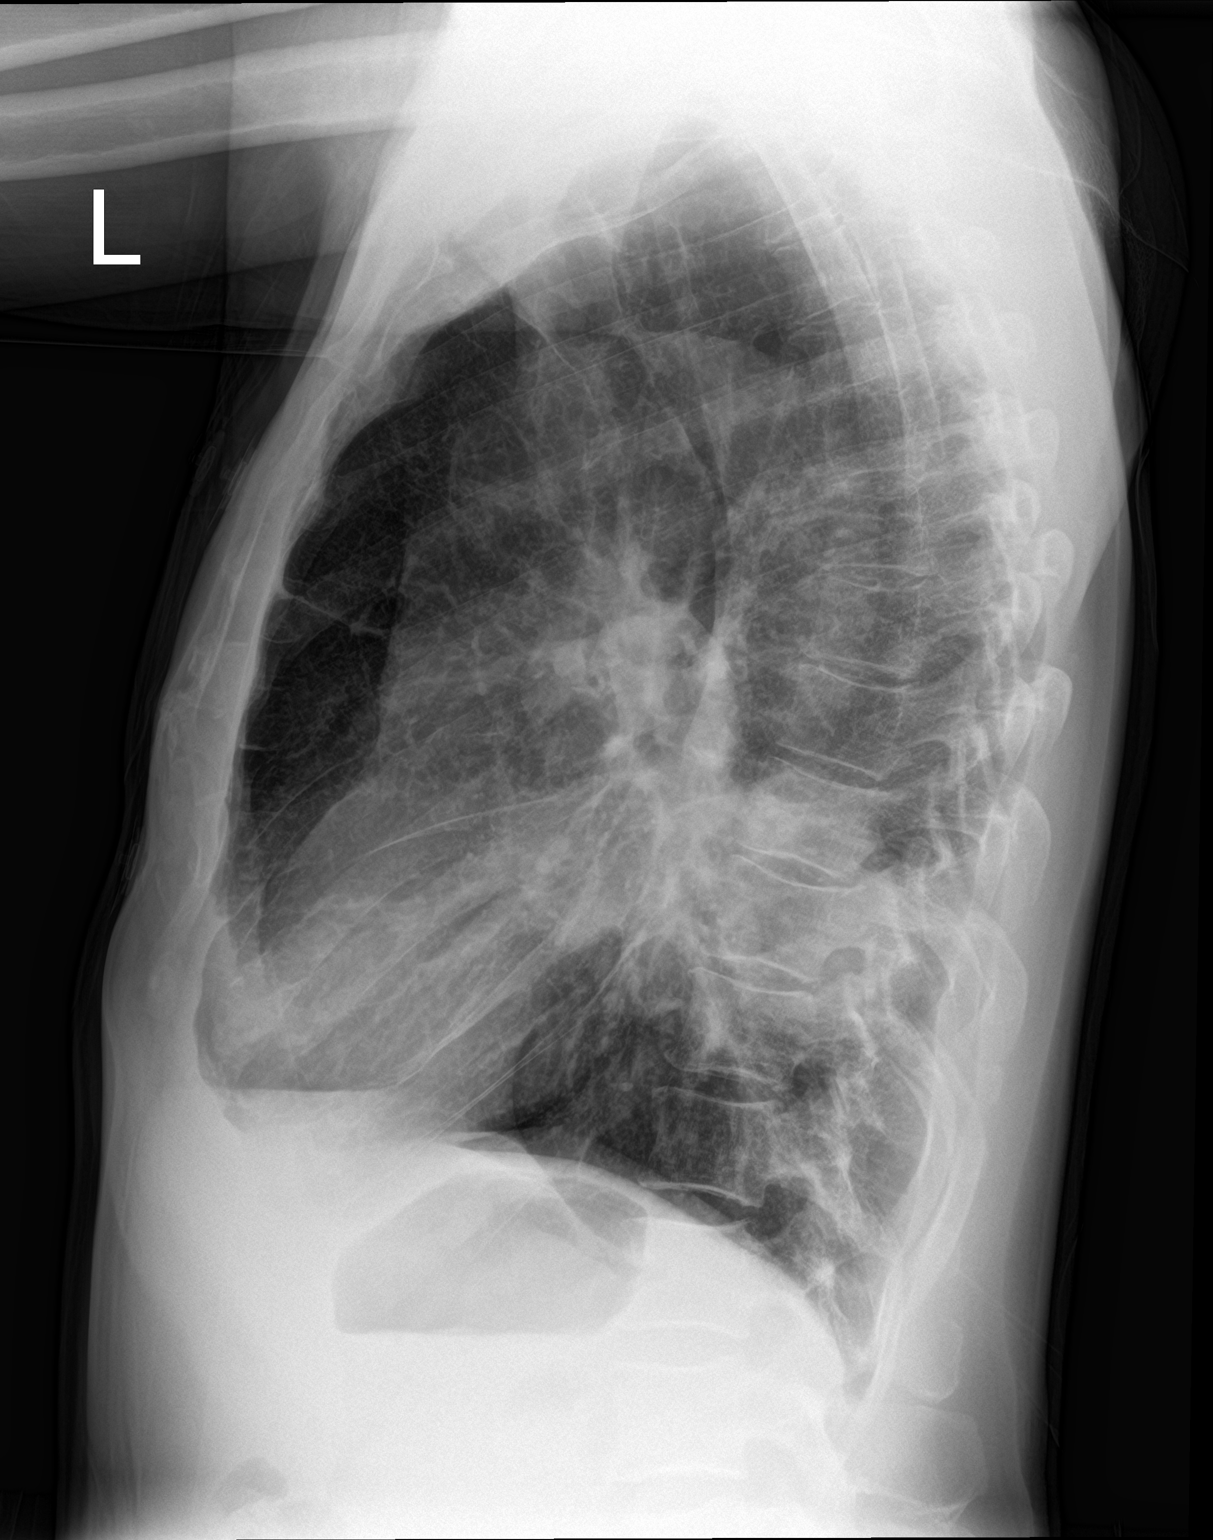

[2 of 2 positions shown; findings below may reference images not displayed]

FINDINGS: A roughly 7.4 x 7.1 x 7.5 cm mass is developed within the perihilar
right lower lobe. Suspected subcarinal adenopathy is present.
Asymmetric bullous emphysema again identified, more severe within
the right apex. No pneumothorax or pleural effusion. Cardiac size
within normal limits. Perihilar interstitial thickening is present
which may reflect mild perihilar edema or airway inflammation. No
acute bone abnormality.
IMPRESSION: Interval development of 7.5 cm right lower lobe mass with suspected
subcarinal adenopathy. This would better assessed with contrast
enhanced CT imaging.

## 2021-09-21 MED ORDER — IOHEXOL 350 MG/ML SOLN
75.0000 mL | Freq: Once | INTRAVENOUS | Status: AC | PRN
  Administered 2021-09-21: 75 mL via INTRAVENOUS

## 2021-09-21 MED ORDER — IPRATROPIUM-ALBUTEROL 0.5-2.5 (3) MG/3ML IN SOLN
3.0000 mL | Freq: Once | RESPIRATORY_TRACT | Status: AC
  Administered 2021-09-22: 3 mL via RESPIRATORY_TRACT
  Filled 2021-09-21: qty 3

## 2021-09-21 NOTE — ED Notes (Signed)
Pt to rm 12 and placed on cardiac monitor. Call light within reach. Pt has no further needs at this time.  ?

## 2021-09-21 NOTE — ED Triage Notes (Signed)
Pt presents with Chest pain & SOB for the last several weeks. He was seen by his PCP and prescribed antibiotics and steroids without improvement. He notes having a hx of COPD and not on O2 at baseline. He endorses "coughing up blood" that's "bright red".  ?

## 2021-09-21 NOTE — ED Provider Notes (Signed)
? ?[facility] ?Provider Note ? ? ? Event Date/Time  ? First MD Initiated Contact with Patient 09/21/21 2302   ?  (approximate) ? ? ?History  ? ?Shortness of Breath and Chest Pain ? ? ?HPI ? ?BERNABE DORCE is a 61 y.o. male  with pmh HTN who presents with shortness of breath.  Symptoms have been going on for about a month.  Patient endorses intermittent hemoptysis which she describes as bright red blood passing out a teaspoon at a time.  Also has dyspnea on exertion and dyspnea waking him up at night.  Has chest tightness.  Has gone to [facility] and has been prescribed a Z-Pak and prednisone and this is not helped.  Denies lower extremity edema.  Has had about a 15 to 20 pound unintentional weight loss over the past 2 months.  Has night sweats as well.  Long history of tobacco use. ? ?  ? ?Past Medical History:  ?Diagnosis Date  ? ED (erectile dysfunction)   ? Hypercalcemia   ? Hypertension   ? RLS (restless legs syndrome)   ? Sciatica   ? ? ?There are no problems to display for this patient. ? ? ? ?Physical Exam  ?Triage Vital Signs: ?ED Triage Vitals [09/21/21 2122]  ?Enc Vitals Group  ?   BP (!) 155/96  ?   Pulse Rate 96  ?   Resp 20  ?   Temp 97.7 ?F (36.5 ?C)  ?   Temp Source Oral  ?   SpO2 95 %  ?   Weight 126 lb (57.2 kg)  ?   Height 5\' 10"  (1.778 m)  ?   Head Circumference   ?   Peak Flow   ?   Pain Score 8  ?   Pain Loc   ?   Pain Edu?   ?   Excl. in [place]?   ? ? ?Most recent vital signs: ?Vitals:  ? 09/21/21 2122 09/21/21 2323  ?BP: (!) 155/96 (!) 166/98  ?Pulse: 96 95  ?Resp: 20 18  ?Temp: 97.7 ?F (36.5 ?C)   ?SpO2: 95% 96%  ? ? ? ?General: Awake, no distress.  Patient is thin appearing ?CV:  Good peripheral perfusion.  No lower extremity edema ?Resp:  Normal effort.  Wheezing throughout ?Abd:  No distention.  ?Neuro:             Awake, Alert, Oriented x 3  ?Other:   ? ? ?ED Results / Procedures / Treatments  ?Labs ?(all labs ordered are listed, but only abnormal results are  displayed) ?Labs Reviewed  ?BASIC METABOLIC PANEL - Abnormal; Notable for the following components:  ?    Result Value  ? Potassium 3.3 (*)   ? All other components within normal limits  ?CBC - Abnormal; Notable for the following components:  ? Hemoglobin 12.7 (*)   ? All other components within normal limits  ?TROPONIN I (HIGH SENSITIVITY) - Abnormal; Notable for the following components:  ? Troponin I (High Sensitivity) 25 (*)   ? All other components within normal limits  ?TROPONIN I (HIGH SENSITIVITY) - Abnormal; Notable for the following components:  ? Troponin I (High Sensitivity) 27 (*)   ? All other components within normal limits  ? ? ? ?EKG ? ?EKG interpreted by myself, sinus tachycardia with a right axis no acute ischemic changes ? ? ?RADIOLOGY ?Xray reviewed by myself shows right middle lobe mass ? ? ?PROCEDURES: ? ?Critical Care performed:  No ? ?Procedures ? ?The patient is on the cardiac monitor to evaluate for evidence of arrhythmia and/or significant heart rate changes. ? ? ?MEDICATIONS ORDERED IN ED: ?Medications  ?ipratropium-albuterol (DUONEB) 0.5-2.5 (3) MG/3ML nebulizer solution 3 mL (3 mLs Nebulization Given 09/22/21 0029)  ?iohexol (OMNIPAQUE) 350 MG/ML injection 75 mL (75 mLs Intravenous Contrast Given 09/21/21 2356)  ? ? ? ?IMPRESSION / MDM / ASSESSMENT AND PLAN / ED COURSE  ?I reviewed the triage vital signs and the nursing notes. ?             ?               ? ?Differential diagnosis includes, but is not limited to, lung cancer, pulmonary embolism ? ?Patient is a 61 year old male with past medical history of tobacco use and hypertension who presents with hemoptysis shortness of breath chest tightness and weight loss.  Chest x-ray shows a likely right middle lobe mass.  Vitals are otherwise within normal limits.  He is overall well-appearing with wheezing.  Troponin is 25 EKG sinus tachycardia.  I am concerned that this is either from cancer with potentially concomitant pulmonary embolism  will obtain a CTA.  Anticipate admission. ? ?CTA shows a large lung mass in the right lower lobe and right middle lobe with no PE.  Given patient does not have insurance and is quite symptomatic with having hemoptysis I do think he should be admitted for oncology and pulmonary work-up.  Discussed with hospitalist for admission. ?  ? ? ?FINAL CLINICAL IMPRESSION(S) / ED DIAGNOSES  ? ?Final diagnoses:  ?Lung mass  ? ? ? ?Rx / DC Orders  ? ?ED Discharge Orders   ? ? None  ? ?  ? ? ? ?Note:  This document was prepared using Dragon voice recognition software and may include unintentional dictation errors. ?  ?Rada Hay, MD ?09/22/21 0032 ? ?

## 2021-09-22 ENCOUNTER — Other Ambulatory Visit: Payer: Self-pay

## 2021-09-22 DIAGNOSIS — R042 Hemoptysis: Secondary | ICD-10-CM

## 2021-09-22 DIAGNOSIS — J439 Emphysema, unspecified: Secondary | ICD-10-CM

## 2021-09-22 DIAGNOSIS — F17200 Nicotine dependence, unspecified, uncomplicated: Secondary | ICD-10-CM

## 2021-09-22 DIAGNOSIS — R079 Chest pain, unspecified: Secondary | ICD-10-CM

## 2021-09-22 DIAGNOSIS — I160 Hypertensive urgency: Secondary | ICD-10-CM

## 2021-09-22 DIAGNOSIS — R918 Other nonspecific abnormal finding of lung field: Secondary | ICD-10-CM

## 2021-09-22 DIAGNOSIS — I10 Essential (primary) hypertension: Secondary | ICD-10-CM | POA: Insufficient documentation

## 2021-09-22 LAB — HIV ANTIBODY (ROUTINE TESTING W REFLEX): HIV Screen 4th Generation wRfx: NONREACTIVE

## 2021-09-22 LAB — TROPONIN I (HIGH SENSITIVITY): Troponin I (High Sensitivity): 27 ng/L — ABNORMAL HIGH (ref <18)

## 2021-09-22 IMAGING — CT CT ANGIO CHEST
2 of 7 series · 17 of 46 positions shown · IV contrast (agent unspecified)
Comparison: None.

CLINICAL DATA: Chest pain and shortness of breath.

EXAM:
CT ANGIOGRAPHY CHEST WITH CONTRAST
TECHNIQUE: Multidetector CT imaging of the chest was performed using the
standard protocol during bolus administration of intravenous
contrast. Multiplanar CT image reconstructions and MIPs were
obtained to evaluate the vascular anatomy.

[Series 5: thins · axial · 0.73mm/px · z∈[-272,+11]mm · 14 of 393 slices shown]
[im 20/393  lung]
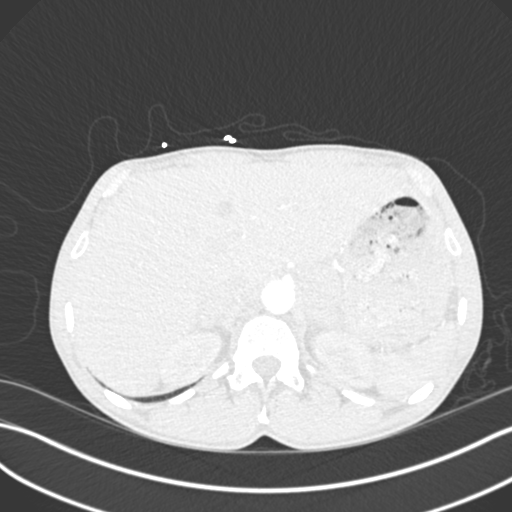
[im 59/393  soft-tissue]
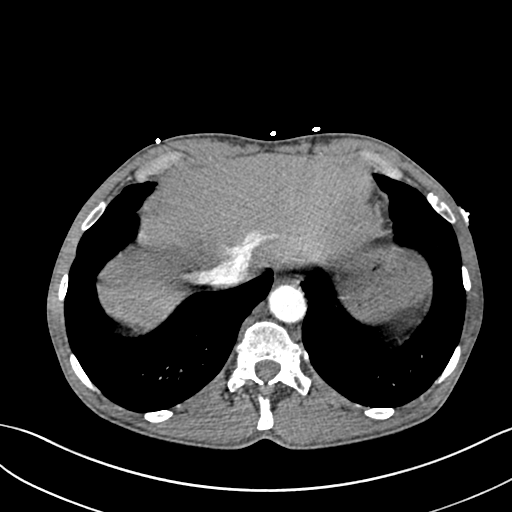
[im 79/393  lung]
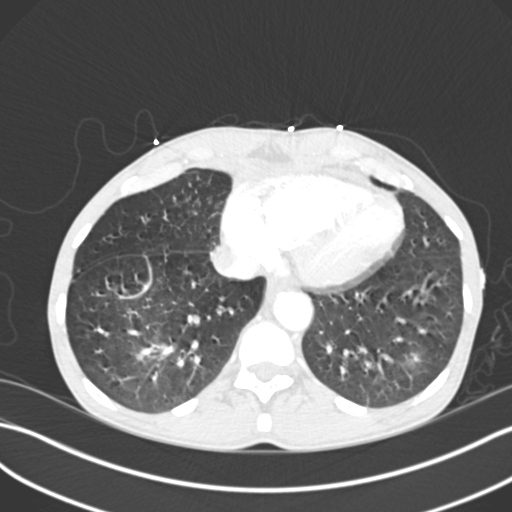
[im 99/393  soft-tissue]
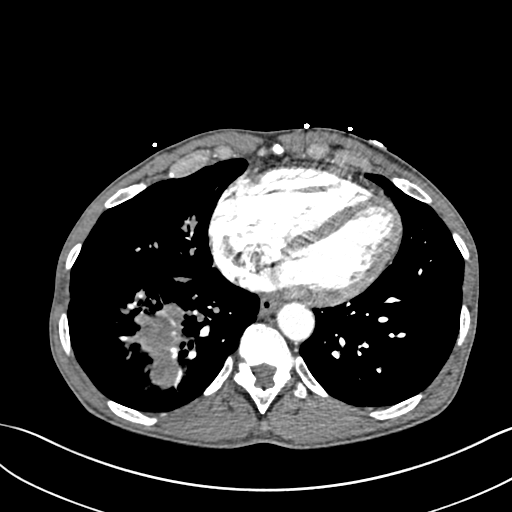
[im 138/393  lung]
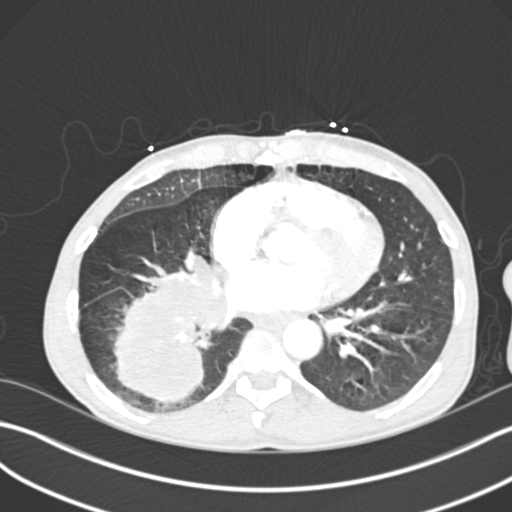
[im 157/393  soft-tissue]
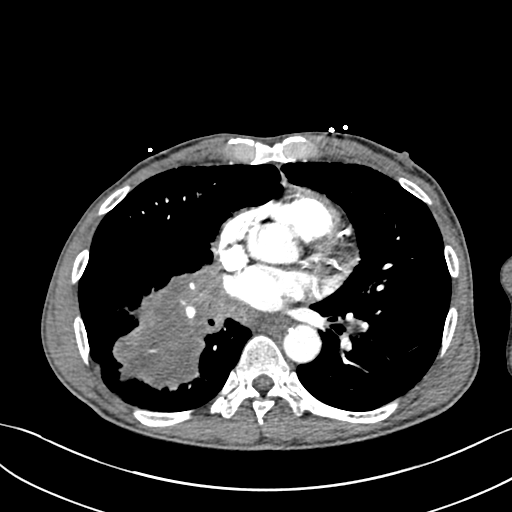
[im 177/393  lung]
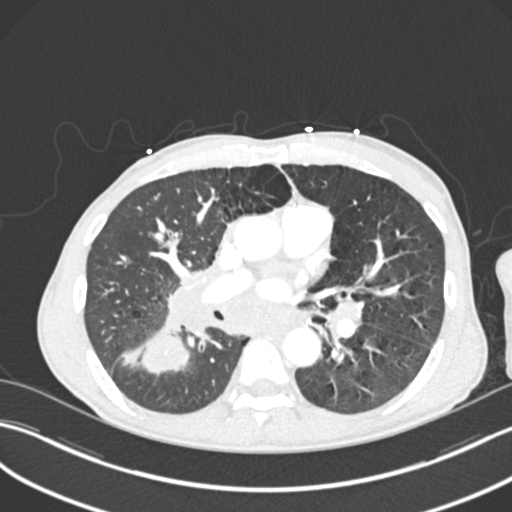
[im 216/393  soft-tissue]
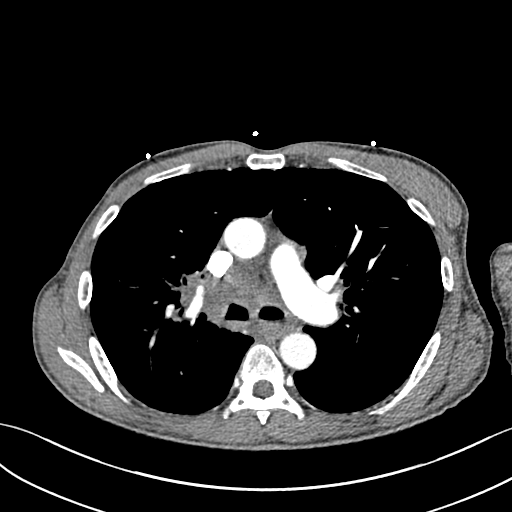
[im 236/393  lung]
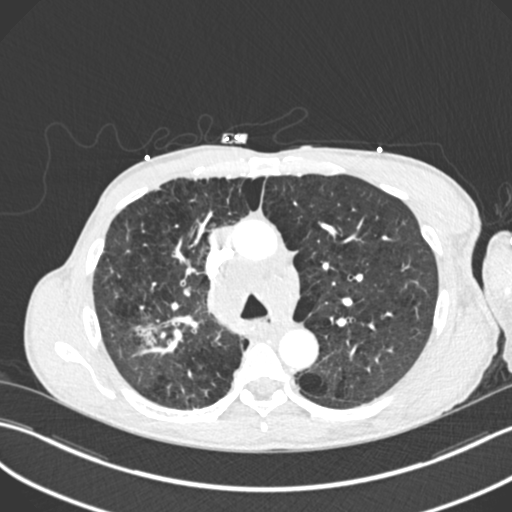
[im 255/393  soft-tissue]
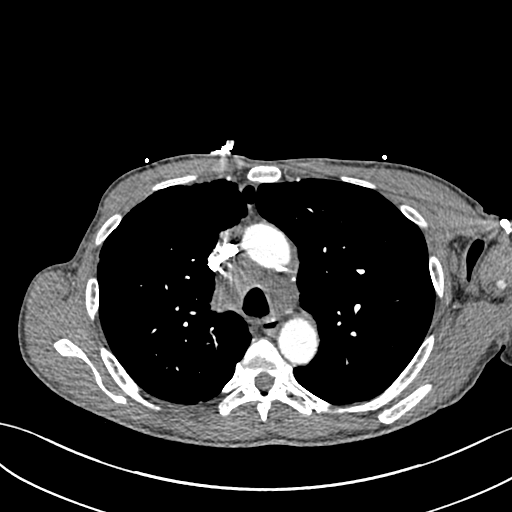
[im 295/393  lung]
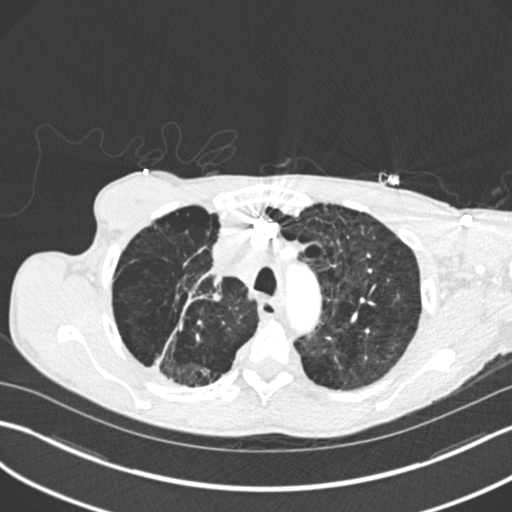
[im 314/393  soft-tissue]
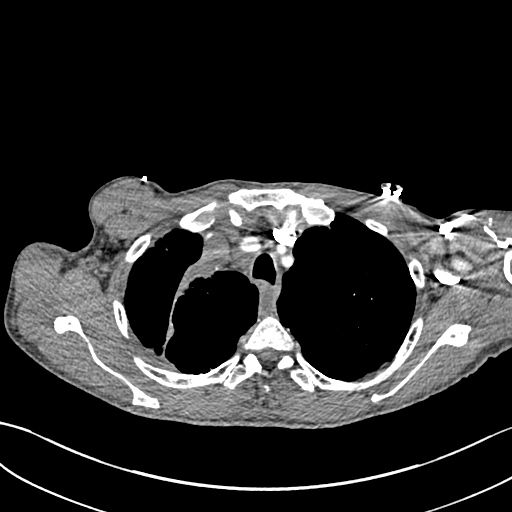
[im 334/393  lung]
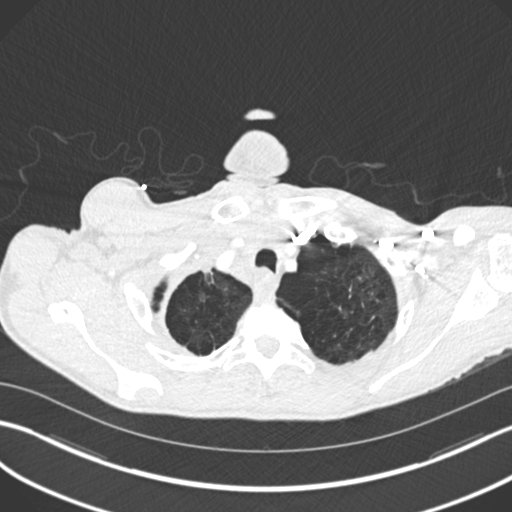
[im 373/393  soft-tissue]
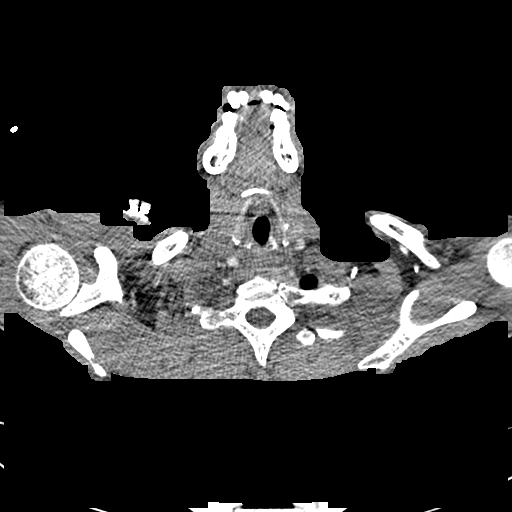

[Series 7: coronal mpr · coronal · 0.61mm/px · 3 of 85 slices shown]
[im 22/85  soft-tissue]
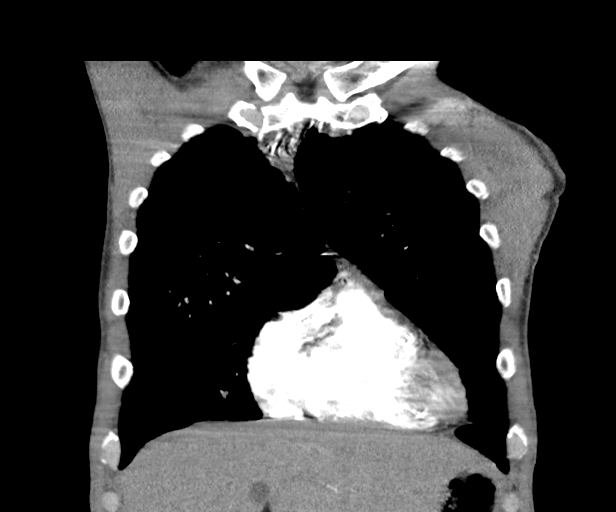
[im 43/85  soft-tissue]
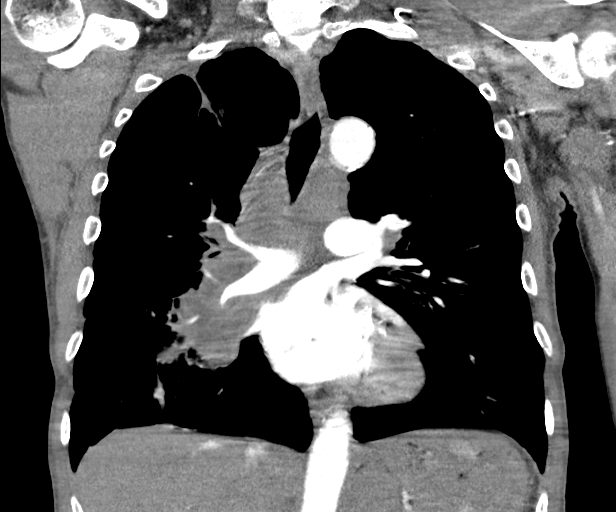
[im 64/85  soft-tissue]
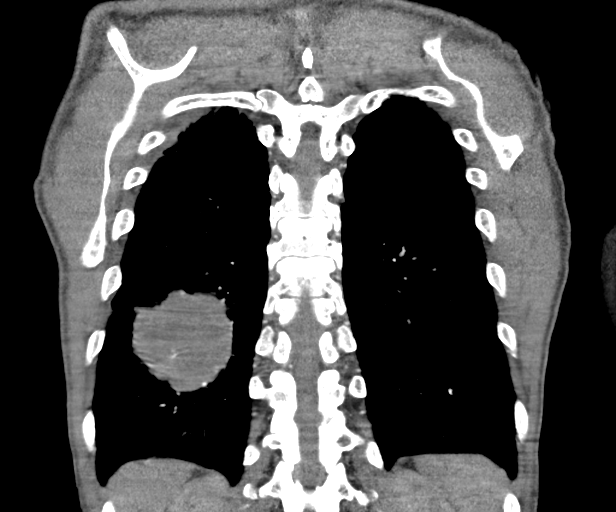

[17 of 46 positions shown; findings below may reference images not displayed]

RADIATION DOSE REDUCTION: This exam was performed according to the
departmental dose-optimization program which includes automated
exposure control, adjustment of the mA and/or kV according to
patient size and/or use of iterative reconstruction technique.

CONTRAST:  75mL OMNIPAQUE IOHEXOL 350 MG/ML SOLN
FINDINGS: Cardiovascular: There is moderate to marked severity calcification
of the aortic arch, without evidence of aortic aneurysm or
dissection. Satisfactory opacification of the pulmonary arteries to
the segmental level. No evidence of pulmonary embolism. Normal heart
size with marked severity coronary artery calcification. No
pericardial effusion.

Mediastinum/Nodes: Moderate to marked severity AP window,
pretracheal, subcarinal and bilateral hilar lymphadenopathy is seen.
Thyroid gland, trachea, and esophagus demonstrate no significant
findings.

Lungs/Pleura: There is marked severity emphysematous lung disease.

Marked severity linear scarring and/or atelectasis is seen within
the right upper lobe.

A 10.9 cm x 7.1 cm lobulated low-attenuation lung mass is seen
within the right lower lobe. There is extension to involve the
adjacent portion of the posteromedial right middle lobe.

A very small patchy area of scarring and/or atelectasis is seen
within the posterolateral aspect of the left lower lobe.

There is no evidence of a pleural effusion or pneumothorax.

Upper Abdomen: A 3.6 cm x 3.2 cm x 2.3 cm partially imaged
para-aortic soft tissue mass is seen, adjacent to the anteromedial
aspect of the mid left kidney.

Musculoskeletal: No chest wall abnormality. No acute or significant
osseous findings.

Review of the MIP images confirms the above findings.
IMPRESSION: 1. No evidence of pulmonary embolism.
2. Large, lobulated low-attenuation lung mass within the right lower
lobe and right middle lobe, consistent with a primary lung
malignancy.
3. Moderate to marked severity mediastinal and bilateral hilar
lymphadenopathy, concerning for metastatic disease.
4. 3.6 cm x 3.2 cm x 2.3 cm partially imaged para-aortic soft tissue
mass, adjacent to the anteromedial aspect of the mid left kidney.
This may represent a metastatic lesion.
5. Marked severity emphysematous lung disease.
6. Coronary artery disease.

Aortic Atherosclerosis ([id number].O) and Emphysema ([id number].B).

## 2021-09-22 MED ORDER — ACETAMINOPHEN 325 MG PO TABS: 650.0000 mg | ORAL_TABLET | Freq: Four times a day (QID) | ORAL | Status: DC | PRN

## 2021-09-22 MED ORDER — POTASSIUM CHLORIDE CRYS ER 20 MEQ PO TBCR
40.0000 meq | EXTENDED_RELEASE_TABLET | Freq: Once | ORAL | Status: AC
  Administered 2021-09-22: 40 meq via ORAL
  Filled 2021-09-22: qty 2

## 2021-09-22 MED ORDER — NICOTINE 21 MG/24HR TD PT24
21.0000 mg | MEDICATED_PATCH | Freq: Every day | TRANSDERMAL | Status: DC
Start: 2021-09-22 — End: 2021-09-22
  Administered 2021-09-22: 21 mg via TRANSDERMAL
  Filled 2021-09-22: qty 1

## 2021-09-22 MED ORDER — ONDANSETRON HCL 4 MG PO TABS: 4.0000 mg | ORAL_TABLET | Freq: Four times a day (QID) | ORAL | Status: DC | PRN

## 2021-09-22 MED ORDER — NICOTINE 21 MG/24HR TD PT24
21.0000 mg | MEDICATED_PATCH | Freq: Every day | TRANSDERMAL | 0 refills | Status: DC
Start: 2021-09-23 — End: 2022-02-03

## 2021-09-22 MED ORDER — IPRATROPIUM-ALBUTEROL 0.5-2.5 (3) MG/3ML IN SOLN
3.0000 mL | RESPIRATORY_TRACT | Status: DC | PRN
  Administered 2021-09-22: 3 mL via RESPIRATORY_TRACT
  Filled 2021-09-22 (×2): qty 3

## 2021-09-22 MED ORDER — ACETAMINOPHEN 650 MG RE SUPP: 650.0000 mg | Freq: Four times a day (QID) | RECTAL | Status: DC | PRN

## 2021-09-22 MED ORDER — SODIUM CHLORIDE 0.9 % IV SOLN: INTRAVENOUS | Status: DC

## 2021-09-22 MED ORDER — IPRATROPIUM-ALBUTEROL 0.5-2.5 (3) MG/3ML IN SOLN
3.0000 mL | Freq: Four times a day (QID) | RESPIRATORY_TRACT | Status: DC
  Administered 2021-09-22: 3 mL via RESPIRATORY_TRACT
  Filled 2021-09-22 (×2): qty 3

## 2021-09-22 MED ORDER — HYDRALAZINE HCL 20 MG/ML IJ SOLN: 5.0000 mg | Freq: Four times a day (QID) | INTRAMUSCULAR | Status: DC | PRN

## 2021-09-22 MED ORDER — MORPHINE SULFATE (PF) 2 MG/ML IV SOLN: 2.0000 mg | INTRAVENOUS | Status: DC | PRN

## 2021-09-22 MED ORDER — ONDANSETRON HCL 4 MG/2ML IJ SOLN: 4.0000 mg | Freq: Four times a day (QID) | INTRAMUSCULAR | Status: DC | PRN

## 2021-09-22 NOTE — Discharge Summary (Signed)
? ?Physician Discharge Summary ? ? ?Arthur Freeman  male DOB: 01-15-1961  ?KYH:[id number] ? ?[facility]P: [facility] ? ?Admit date: 09/21/2021 ?Discharge date: 09/22/2021 ? ?Admitted From: home ?Disposition:  home ?Daughter updated at bedside prior to discharge. ? ?CODE STATUS: Full code ? ?Discharge Instructions   ? ? Discharge instructions   Complete by: As directed ?  ? Since you prefer to have your cancer workup and biopsy done at [facility], please make sure to follow up with your PCP on Monday to make referrals to oncology and pulmonology. ? ? ?Dr. Enzo Bi ?- ?-  ? ?  ? ?Hospital Course:  ?For full details, please see H&P, progress notes, consult notes and ancillary notes.  ?Briefly,  ?Arthur Freeman is a 61 y.o. male with medical history significant for Hypertension and tobacco use presenting with a several week history of shortness of breath associated with chest tightness and hemoptysis, coughing out a teaspoon of blood each time.  He also reports a several pound weight loss over the past several weeks.  Was prescribed an antibiotic and steroids by his PCP with no significant improvement. ? ?* Cough with hemoptysis 2/2 ?Lung cancer with mets ?--CTA chest showed Large, lobulated low-attenuation lung mass within the right lower ?lobe and right middle lobe, consistent with a primary lung malignancy.  Also showed Moderate to marked severity mediastinal and bilateral hilar lymphadenopathy, concerning for metastatic disease. ?--pt preferred to have cancer workup and biopsy done at [facility], so pt was advised to follow up with PCP to make referrals to oncology and pulmonology at [facility]. ?  ?Chest pain ?Likely secondary to lung mass.  Troponin slightly elevated at 27 but suspect demand ischemia noncardiac as EKG nonacute. ?  ?Hypertensive urgency ?--BP remained elevated.  At discharge, pt said he takes BP medication at home, however, they were not listed under home med.  Pt didn't want to  review home med with me and said he will go back to taking what he takes at home.   ?  ?Emphysema lung ([place]) ?Marked severity emphysema on CT chest. ?--pt advised to follow up with pulm at [facility]. ?  ?Tobacco use disorder ?Nicotine patch ordered at discharge. ?Smoking cessation counseling provided. ?  ? ? ?Discharge Diagnoses:  ?Principal Problem: ?  Cough with hemoptysis ?Active Problems: ?  Chest pain ?  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, 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 ?  Hypertensive urgency ?  Tobacco use disorder ?  Emphysema lung ([place]) ? ? ?30 Day Unplanned Readmission Risk Score   ? ?Flowsheet Row ED to Hosp-Admission (Current) from 09/21/2021 in [place]  ?30 Day Unplanned Readmission Risk Score (%) 7.78 Filed at 09/22/2021 1200  ? ?  ? ? This score is the patient's risk of an unplanned readmission within 30 days of being discharged (0 -100%). The score is based on dignosis, age, lab data, medications, orders, and past utilization.   ?Low:  0-14.9   Medium: 15-21.9   High: 22-29.9   Extreme: 30 and above ? ?  ? ?  ? ? ?Discharge Instructions: ? ?Allergies as of 09/22/2021   ? ?   Reactions  ? Lisinopril   ? Norvasc [amlodipine Besylate]   ? ?  ? ?  ?Medication List  ?  ? ?TAKE these medications   ? ?nicotine 21 mg/24hr patch ?Commonly known as: NICODERM CQ - dosed in mg/24 hours ?Place 1 patch (21 mg total) onto the  skin daily. ?Start taking on: September 23, 2021 ?  ?omeprazole 40 MG capsule ?Commonly known as: PriLOSEC ?Take 1 capsule (40 mg total) by mouth daily. ?  ? ?  ? ? ? ? ?Allergies  ?Allergen Reactions  ? Lisinopril   ? Norvasc [Amlodipine Besylate]   ? ? ? ?The results of significant diagnostics from this hospitalization (including imaging, microbiology, ancillary and laboratory) are listed below for reference.  ? ?Consultations: ? ? ?Procedures/Studies: ?DG Chest 2 View ? ?Result Date: 09/21/2021 ?CLINICAL DATA:  Chest pain EXAM: CHEST - 2 VIEW COMPARISON:  08/30/2015 FINDINGS:  A roughly 7.4 x 7.1 x 7.5 cm mass is developed within the perihilar right lower lobe. Suspected subcarinal adenopathy is present. Asymmetric bullous emphysema again identified, more severe within the right apex. No pneumothorax or pleural effusion. Cardiac size within normal limits. Perihilar interstitial thickening is present which may reflect mild perihilar edema or airway inflammation. No acute bone abnormality. IMPRESSION: Interval development of 7.5 cm right lower lobe mass with suspected subcarinal adenopathy. This would better assessed with contrast enhanced CT imaging. Electronically Signed   By: Fidela Salisbury M.D.   On: 09/21/2021 22:02  ? ?CT Angio Chest PE W and/or Wo Contrast ? ?Result Date: 09/22/2021 ?CLINICAL DATA:  Chest pain and shortness of breath. EXAM: CT ANGIOGRAPHY CHEST WITH CONTRAST TECHNIQUE: Multidetector CT imaging of the chest was performed using the standard protocol during bolus administration of intravenous contrast. Multiplanar CT image reconstructions and MIPs were obtained to evaluate the vascular anatomy. RADIATION DOSE REDUCTION: This exam was performed according to the departmental dose-optimization program which includes automated exposure control, adjustment of the mA and/or kV according to patient size and/or use of iterative reconstruction technique. CONTRAST:  45mL OMNIPAQUE IOHEXOL 350 MG/ML SOLN COMPARISON:  None. FINDINGS: Cardiovascular: There is moderate to marked severity calcification of the aortic arch, without evidence of aortic aneurysm or dissection. Satisfactory opacification of the pulmonary arteries to the segmental level. No evidence of pulmonary embolism. Normal heart size with marked severity coronary artery calcification. No pericardial effusion. Mediastinum/Nodes: Moderate to marked severity AP window, pretracheal, subcarinal and bilateral hilar lymphadenopathy is seen. Thyroid gland, trachea, and esophagus demonstrate no significant findings.  Lungs/Pleura: There is marked severity emphysematous lung disease. Marked severity linear scarring and/or atelectasis is seen within the right upper lobe. A 10.9 cm x 7.1 cm lobulated low-attenuation lung mass is seen within the right lower lobe. There is extension to involve the adjacent portion of the posteromedial right middle lobe. A very small patchy area of scarring and/or atelectasis is seen within the posterolateral aspect of the left lower lobe. There is no evidence of a pleural effusion or pneumothorax. Upper Abdomen: A 3.6 cm x 3.2 cm x 2.3 cm partially imaged para-aortic soft tissue mass is seen, adjacent to the anteromedial aspect of the mid left kidney. Musculoskeletal: No chest wall abnormality. No acute or significant osseous findings. Review of the MIP images confirms the above findings. IMPRESSION: 1. No evidence of pulmonary embolism. 2. Large, lobulated low-attenuation lung mass within the right lower lobe and right middle lobe, consistent with a primary lung malignancy. 3. Moderate to marked severity mediastinal and bilateral hilar lymphadenopathy, concerning for metastatic disease. 4. 3.6 cm x 3.2 cm x 2.3 cm partially imaged para-aortic soft tissue mass, adjacent to the anteromedial aspect of the mid left kidney. This may represent a metastatic lesion. 5. Marked severity emphysematous lung disease. 6. Coronary artery disease. Aortic Atherosclerosis (ICD10-I70.0) and Emphysema (ICD10-J43.9). Electronically Signed  By: Virgina Norfolk M.D.   On: 09/22/2021 00:15   ? ? ? ?Labs: ?BNP (last 3 results) ?No results for input(s): BNP in the last 8760 hours. ?Basic Metabolic Panel: ?Recent Labs  ?Lab 09/21/21 ?2128  ?NA 138  ?K 3.3*  ?CL 102  ?CO2 29  ?GLUCOSE 98  ?BUN 18  ?CREATININE 0.80  ?CALCIUM 10.0  ? ?Liver Function Tests: ?No results for input(s): AST, ALT, ALKPHOS, BILITOT, PROT, ALBUMIN in the last 168 hours. ?No results for input(s): LIPASE, AMYLASE in the last 168 hours. ?No results for  input(s): AMMONIA in the last 168 hours. ?CBC: ?Recent Labs  ?Lab 09/21/21 ?2128  ?WBC 9.4  ?HGB 12.7*  ?HCT 40.3  ?MCV 95.3  ?PLT 339  ? ?Cardiac Enzymes: ?No results for input(s): CKTOTAL, CKMB, CKMBINDEX, TROPO

## 2021-09-22 NOTE — Progress Notes (Signed)
Discharge instructions and med details reviewed with patient. Patient verbalizes understanding. Printed AVS given to patient as well as [facility] application.  ?IV removed. ?Patient escorted out via wheelchair to private vehicle with daughter.  ? ?Wynema Birch ? ?

## 2021-09-22 NOTE — Assessment & Plan Note (Signed)
Hydralazine IV as needed while n.p.o. for goal systolic under 778 ?

## 2021-09-22 NOTE — Assessment & Plan Note (Signed)
Likely secondary to lung mass.  Troponin slightly elevated at 27 but suspect demand ischemia noncardiac as EKG nonacute ?Morphine as needed and continue to monitor for worsening ?

## 2021-09-22 NOTE — TOC CM/SW Note (Signed)
CSW provided [facility] Management application. ? Oleh Genin, LCSW ?[phone] ? ?

## 2021-09-22 NOTE — Assessment & Plan Note (Addendum)
Nicotine patch if agreeable ?Smoking cessation counseling ?

## 2021-09-22 NOTE — Assessment & Plan Note (Signed)
Marked severity emphysema on CT chest ?DuoNebs as needed ?

## 2021-09-22 NOTE — H&P (Signed)
?History and Physical  ? ? ?Patient: Arthur Freeman MBT:[id number] DOB: 17-Feb-1961 ?DOA: 09/21/2021 ?DOS: the patient was seen and examined on 09/22/2021 ?PCP: [facility]  ?Patient coming from: Home ? ?Chief Complaint:  ?Chief Complaint  ?Patient presents with  ? Shortness of Breath  ? Chest Pain  ? ? ?HPI: Arthur Freeman is a 61 y.o. male with medical history significant for Hypertension and tobacco use presenting with a several week history of shortness of breath no associated with chest tightness and hemoptysis, coughing out a teaspoon of blood each time.  He also reports a several pound weight loss over the past several weeks.  Denies fever or chills.  Was prescribed an antibiotic and steroids by his PCP with no significant improvement. ?ED course and data review: BP 184/107 with otherwise normal vitals blood work significant for potassium of 3.3, hemoglobin 12.7 and troponin 27.  EKG, personally viewed and interpreted shows normal sinus at 92 with nonspecific ST-T wave changes.  CT angio chest PE protocol shows the following findings: ?IMPRESSION: ?1. No evidence of pulmonary embolism. ?2. Large, lobulated low-attenuation lung mass within the right lower ?lobe and right middle lobe, consistent with a primary lung ?malignancy. ?3. Moderate to marked severity mediastinal and bilateral hilar ?lymphadenopathy, concerning for metastatic disease. ?4. 3.6 cm x 3.2 cm x 2.3 cm partially imaged para-aortic soft tissue ?mass, adjacent to the anteromedial aspect of the mid left kidney. ?This may represent a metastatic lesion. ?5. Marked severity emphysematous lung disease. ?6. Coronary artery disease. ? ?Patient treated with DuoNeb.  Hospitalist consulted for admission.  ? ?Review of Systems: As mentioned in the history of present illness. All other systems reviewed and are negative. ?Past Medical History:  ?Diagnosis Date  ? ED (erectile dysfunction)   ? Hypercalcemia   ? Hypertension   ? RLS  (restless legs syndrome)   ? Sciatica   ? ?History reviewed. No pertinent surgical history. ?Social History:  reports that he has been smoking cigarettes. He has been smoking an average of 1 pack per day. He has never used smokeless tobacco. He reports current alcohol use. He reports current drug use. Drug: Marijuana. ? ?Allergies  ?Allergen Reactions  ? Lisinopril   ? Norvasc [Amlodipine Besylate]   ? ? ?History reviewed. No pertinent family history. ? ?Prior to Admission medications   ?Medication Sig Start Date End Date Taking? Authorizing Provider  ?omeprazole (PRILOSEC) 40 MG capsule Take 1 capsule (40 mg total) by mouth daily. 09/29/17 09/29/18  Lavonia Drafts, MD  ? ? ?Physical Exam: ?Vitals:  ? 09/21/21 2122 09/21/21 2323 09/22/21 0055  ?BP: (!) 155/96 (!) 166/98 (!) 184/107  ?Pulse: 96 95 95  ?Resp: 20 18 20   ?Temp: 97.7 ?F (36.5 ?C)    ?TempSrc: Oral    ?SpO2: 95% 96% 95%  ?Weight: 57.2 kg    ?Height: 5\' 10"  (1.778 m)    ? ?Physical Exam ?Vitals and nursing note reviewed.  ?Constitutional:   ?   General: He is not in acute distress. ?   Appearance: He is cachectic. He is ill-appearing.  ?HENT:  ?   Head: Normocephalic and atraumatic.  ?Cardiovascular:  ?   Rate and Rhythm: Normal rate and regular rhythm.  ?   Pulses: Normal pulses.  ?   Heart sounds: Normal heart sounds.  ?Pulmonary:  ?   Effort: Pulmonary effort is normal.  ?   Breath sounds: Examination of the right-middle field reveals decreased breath sounds. Decreased breath sounds  present.  ?Abdominal:  ?   Palpations: Abdomen is soft.  ?   Tenderness: There is no abdominal tenderness.  ?Neurological:  ?   Mental Status: Mental status is at baseline.  ? ? ? ?Data Reviewed: ?Relevant notes from primary care and specialist visits, past discharge summaries as available in EHR, including Care Everywhere. ?Prior diagnostic testing as pertinent to current admission diagnoses ?Updated medications and problem lists for reconciliation ?ED course, including  vitals, labs, imaging, treatment and response to treatment ?Triage notes, nursing and pharmacy notes and ED provider's notes ?Notable results as noted in HPI ? ? ?Assessment and Plan: ?* Cough with hemoptysis ?Secondary to lung mass, likely malignancy as well as emphysema seen on CT ?Pulmonology and oncology consults in the a.m. ?DuoNebs as needed ?Aspiration precautions ? ? ?Chest pain ?Likely secondary to lung mass.  Troponin slightly elevated at 27 but suspect demand ischemia noncardiac as EKG nonacute ?Morphine as needed and continue to monitor for worsening ? ?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, 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 ?Management as above ?We will keep n.p.o. tonight in case of procedure in the a.m. ? ?Hypertensive urgency ?Hydralazine IV as needed while n.p.o. for goal systolic under 814 ? ?Emphysema lung ([place]) ?Marked severity emphysema on CT chest ?DuoNebs as needed ? ?Tobacco use disorder ?Nicotine patch if agreeable ?Smoking cessation counseling ? ? ? ? ? ? ?Advance Care Planning:   Code Status: Not on file full ? ?Consults: none ? ?Family Communication: none ? ?Severity of Illness: ?The appropriate patient status for this patient is INPATIENT. Inpatient status is judged to be reasonable and necessary in order to provide the required intensity of service to ensure the patient's safety. The patient's presenting symptoms, physical exam findings, and initial radiographic and laboratory data in the context of their chronic comorbidities is felt to place them at high risk for further clinical deterioration. Furthermore, it is not anticipated that the patient will be medically stable for discharge from the hospital within 2 midnights of admission.  ? ?* I certify that at the point of admission it is my clinical judgment that the patient will require inpatient hospital care spanning beyond 2 midnights from the point of admission due to high intensity of service, high risk for further deterioration and high frequency of  surveillance required.* ? ?Author: ?Athena Masse, MD ?09/22/2021 1:45 AM ? ?For on call review [email or web address].  ?

## 2021-09-22 NOTE — Assessment & Plan Note (Addendum)
Management as above ?We will keep n.p.o. tonight in case of procedure in the a.m. ?

## 2021-09-22 NOTE — Assessment & Plan Note (Addendum)
Secondary to lung mass, likely malignancy as well as emphysema seen on CT ?Pulmonology and oncology consults in the a.m. ?DuoNebs as needed ?Aspiration precautions ? ?

## 2021-09-22 NOTE — ED Notes (Signed)
Pt notes he has some relief from neb treatment ?

## 2022-02-03 ENCOUNTER — Emergency Department: Payer: Medicaid Other

## 2022-02-03 ENCOUNTER — Inpatient Hospital Stay
Admission: EM | Admit: 2022-02-03 | Discharge: 2022-02-06 | DRG: 871 | Disposition: A | Discharge status: Hospice | Payer: Medicaid Other | Attending: Hospitalist | Admitting: Hospitalist

## 2022-02-03 DIAGNOSIS — E43 Unspecified severe protein-calorie malnutrition: Secondary | ICD-10-CM | POA: Insufficient documentation

## 2022-02-03 DIAGNOSIS — C3431 Malignant neoplasm of lower lobe, right bronchus or lung: Secondary | ICD-10-CM | POA: Diagnosis present

## 2022-02-03 DIAGNOSIS — J9600 Acute respiratory failure, unspecified whether with hypoxia or hypercapnia: Secondary | ICD-10-CM | POA: Diagnosis present

## 2022-02-03 DIAGNOSIS — Z7951 Long term (current) use of inhaled steroids: Secondary | ICD-10-CM

## 2022-02-03 DIAGNOSIS — E871 Hypo-osmolality and hyponatremia: Secondary | ICD-10-CM | POA: Diagnosis present

## 2022-02-03 DIAGNOSIS — Z79899 Other long term (current) drug therapy: Secondary | ICD-10-CM

## 2022-02-03 DIAGNOSIS — E876 Hypokalemia: Secondary | ICD-10-CM | POA: Diagnosis present

## 2022-02-03 DIAGNOSIS — Z515 Encounter for palliative care: Secondary | ICD-10-CM

## 2022-02-03 DIAGNOSIS — C7951 Secondary malignant neoplasm of bone: Secondary | ICD-10-CM | POA: Diagnosis present

## 2022-02-03 DIAGNOSIS — R64 Cachexia: Secondary | ICD-10-CM | POA: Diagnosis present

## 2022-02-03 DIAGNOSIS — I160 Hypertensive urgency: Secondary | ICD-10-CM | POA: Diagnosis present

## 2022-02-03 DIAGNOSIS — Z66 Do not resuscitate: Secondary | ICD-10-CM | POA: Diagnosis not present

## 2022-02-03 DIAGNOSIS — F17210 Nicotine dependence, cigarettes, uncomplicated: Secondary | ICD-10-CM | POA: Diagnosis present

## 2022-02-03 DIAGNOSIS — J439 Emphysema, unspecified: Secondary | ICD-10-CM | POA: Diagnosis present

## 2022-02-03 DIAGNOSIS — I10 Essential (primary) hypertension: Secondary | ICD-10-CM | POA: Diagnosis present

## 2022-02-03 DIAGNOSIS — C7971 Secondary malignant neoplasm of right adrenal gland: Secondary | ICD-10-CM | POA: Diagnosis present

## 2022-02-03 DIAGNOSIS — E8809 Other disorders of plasma-protein metabolism, not elsewhere classified: Secondary | ICD-10-CM | POA: Diagnosis present

## 2022-02-03 DIAGNOSIS — Z7982 Long term (current) use of aspirin: Secondary | ICD-10-CM

## 2022-02-03 DIAGNOSIS — J9602 Acute respiratory failure with hypercapnia: Secondary | ICD-10-CM | POA: Diagnosis present

## 2022-02-03 DIAGNOSIS — I959 Hypotension, unspecified: Secondary | ICD-10-CM | POA: Diagnosis present

## 2022-02-03 DIAGNOSIS — C7972 Secondary malignant neoplasm of left adrenal gland: Secondary | ICD-10-CM | POA: Diagnosis present

## 2022-02-03 DIAGNOSIS — I248 Other forms of acute ischemic heart disease: Secondary | ICD-10-CM | POA: Diagnosis present

## 2022-02-03 DIAGNOSIS — Z20822 Contact with and (suspected) exposure to covid-19: Secondary | ICD-10-CM | POA: Diagnosis present

## 2022-02-03 DIAGNOSIS — J810 Acute pulmonary edema: Secondary | ICD-10-CM

## 2022-02-03 DIAGNOSIS — J189 Pneumonia, unspecified organism: Secondary | ICD-10-CM | POA: Diagnosis present

## 2022-02-03 DIAGNOSIS — G893 Neoplasm related pain (acute) (chronic): Secondary | ICD-10-CM | POA: Diagnosis present

## 2022-02-03 DIAGNOSIS — A419 Sepsis, unspecified organism: Principal | ICD-10-CM | POA: Diagnosis present

## 2022-02-03 DIAGNOSIS — G9341 Metabolic encephalopathy: Secondary | ICD-10-CM | POA: Diagnosis present

## 2022-02-03 DIAGNOSIS — Z9221 Personal history of antineoplastic chemotherapy: Secondary | ICD-10-CM

## 2022-02-03 DIAGNOSIS — Z681 Body mass index (BMI) 19 or less, adult: Secondary | ICD-10-CM

## 2022-02-03 DIAGNOSIS — C3491 Malignant neoplasm of unspecified part of right bronchus or lung: Secondary | ICD-10-CM

## 2022-02-03 DIAGNOSIS — F064 Anxiety disorder due to known physiological condition: Secondary | ICD-10-CM | POA: Diagnosis present

## 2022-02-03 DIAGNOSIS — J9601 Acute respiratory failure with hypoxia: Secondary | ICD-10-CM | POA: Diagnosis present

## 2022-02-03 LAB — CBC WITH DIFFERENTIAL/PLATELET
Abs Immature Granulocytes: 0.12 10*3/uL — ABNORMAL HIGH (ref 0.00–0.07)
Basophils Absolute: 0.1 10*3/uL (ref 0.0–0.1)
Basophils Relative: 1 %
Eosinophils Absolute: 0 10*3/uL (ref 0.0–0.5)
Eosinophils Relative: 0 %
HCT: 37.3 % — ABNORMAL LOW (ref 39.0–52.0)
Hemoglobin: 11.7 g/dL — ABNORMAL LOW (ref 13.0–17.0)
Immature Granulocytes: 1 %
Lymphocytes Relative: 17 %
Lymphs Abs: 2.1 10*3/uL (ref 0.7–4.0)
MCH: 30.5 pg (ref 26.0–34.0)
MCHC: 31.4 g/dL (ref 30.0–36.0)
MCV: 97.4 fL (ref 80.0–100.0)
Monocytes Absolute: 1.7 10*3/uL — ABNORMAL HIGH (ref 0.1–1.0)
Monocytes Relative: 14 %
Neutro Abs: 8.1 10*3/uL — ABNORMAL HIGH (ref 1.7–7.7)
Neutrophils Relative %: 67 %
Platelets: 282 10*3/uL (ref 150–400)
RBC: 3.83 MIL/uL — ABNORMAL LOW (ref 4.22–5.81)
RDW: 20.4 % — ABNORMAL HIGH (ref 11.5–15.5)
WBC: 12.2 10*3/uL — ABNORMAL HIGH (ref 4.0–10.5)
nRBC: 0 % (ref 0.0–0.2)

## 2022-02-03 LAB — TROPONIN I (HIGH SENSITIVITY): Troponin I (High Sensitivity): 35 ng/L — ABNORMAL HIGH (ref <18)

## 2022-02-03 LAB — BLOOD GAS, ARTERIAL
Bicarbonate: 28 mmol/L (ref 20.0–28.0)
FIO2: 50 %
MECHVT: 450 mL
Mechanical Rate: 18
O2 Saturation: 98.6 %
PEEP: 5 cmH2O
pCO2 arterial: 57 mmHg — ABNORMAL HIGH (ref 32–48)
pH, Arterial: 7.3 — ABNORMAL LOW (ref 7.35–7.45)
pO2, Arterial: 109 mmHg — ABNORMAL HIGH (ref 83–108)

## 2022-02-03 LAB — URINALYSIS, ROUTINE W REFLEX MICROSCOPIC
Bilirubin Urine: NEGATIVE
Glucose, UA: NEGATIVE mg/dL
Hgb urine dipstick: NEGATIVE
Ketones, ur: NEGATIVE mg/dL
Leukocytes,Ua: NEGATIVE
Nitrite: NEGATIVE
Protein, ur: 100 mg/dL — AB
Specific Gravity, Urine: 1.015 (ref 1.005–1.030)
pH: 7 (ref 5.0–8.0)

## 2022-02-03 LAB — BRAIN NATRIURETIC PEPTIDE: B Natriuretic Peptide: 144.6 pg/mL — ABNORMAL HIGH (ref 0.0–100.0)

## 2022-02-03 LAB — PROTIME-INR
INR: 1 (ref 0.8–1.2)
Prothrombin Time: 13.5 seconds (ref 11.4–15.2)

## 2022-02-03 LAB — LACTIC ACID, PLASMA: Lactic Acid, Venous: 2.1 mmol/L (ref 0.5–1.9)

## 2022-02-03 LAB — CBG MONITORING, ED: Glucose-Capillary: 177 mg/dL — ABNORMAL HIGH (ref 70–99)

## 2022-02-03 MED ORDER — PHENYLEPHRINE 80 MCG/ML (10ML) SYRINGE FOR IV PUSH (FOR BLOOD PRESSURE SUPPORT)
80.0000 ug | PREFILLED_SYRINGE | INTRAVENOUS | Status: AC | PRN
  Administered 2022-02-03 (×2): 160 ug via INTRAVENOUS
  Administered 2022-02-03 (×4): 80 ug via INTRAVENOUS

## 2022-02-03 MED ORDER — PHENYLEPHRINE 80 MCG/ML (10ML) SYRINGE FOR IV PUSH (FOR BLOOD PRESSURE SUPPORT)
80.0000 ug | PREFILLED_SYRINGE | Freq: Once | INTRAVENOUS | Status: AC | PRN
  Administered 2022-02-03: 80 ug via INTRAVENOUS

## 2022-02-03 MED ORDER — ROCURONIUM BROMIDE 10 MG/ML (PF) SYRINGE
PREFILLED_SYRINGE | INTRAVENOUS | Status: AC
  Administered 2022-02-03: 60 mg via INTRAVENOUS
  Filled 2022-02-03: qty 10

## 2022-02-03 MED ORDER — PROPOFOL 1000 MG/100ML IV EMUL
INTRAVENOUS | Status: AC
  Administered 2022-02-03: 120 mg via INTRAVENOUS
  Filled 2022-02-03: qty 100

## 2022-02-03 MED ORDER — FENTANYL BOLUS VIA INFUSION
100.0000 ug | INTRAVENOUS | Status: DC | PRN
  Administered 2022-02-04 (×2): 100 ug via INTRAVENOUS

## 2022-02-03 MED ORDER — FENTANYL 2500MCG IN NS 250ML (10MCG/ML) PREMIX INFUSION
0.0000 ug/h | INTRAVENOUS | Status: DC
  Administered 2022-02-03: 50 ug/h via INTRAVENOUS
  Filled 2022-02-03: qty 250

## 2022-02-03 MED ORDER — SODIUM CHLORIDE 0.9 % IV SOLN
2.0000 g | Freq: Once | INTRAVENOUS | Status: AC
  Administered 2022-02-03: 2 g via INTRAVENOUS
  Filled 2022-02-03: qty 12.5

## 2022-02-03 MED ORDER — PROPOFOL 1000 MG/100ML IV EMUL
5.0000 ug/kg/min | INTRAVENOUS | Status: DC
  Administered 2022-02-03: 30 ug/kg/min via INTRAVENOUS
  Administered 2022-02-04: 35 ug/kg/min via INTRAVENOUS
  Administered 2022-02-04: 40 ug/kg/min via INTRAVENOUS
  Filled 2022-02-03 (×2): qty 100

## 2022-02-03 MED ORDER — PROPOFOL 10 MG/ML IV BOLUS: 2.0000 mg/kg | Freq: Once | INTRAVENOUS | Status: AC

## 2022-02-03 MED ORDER — ROCURONIUM BROMIDE 10 MG/ML (PF) SYRINGE
1.0000 mg/kg | PREFILLED_SYRINGE | Freq: Once | INTRAVENOUS | Status: AC
  Filled 2022-02-03: qty 5.91

## 2022-02-03 MED ORDER — SODIUM CHLORIDE 0.9 % IV SOLN
500.0000 mg | Freq: Once | INTRAVENOUS | Status: AC
  Administered 2022-02-03: 500 mg via INTRAVENOUS
  Filled 2022-02-03: qty 5

## 2022-02-03 MED ORDER — LACTATED RINGERS IV BOLUS
1000.0000 mL | Freq: Once | INTRAVENOUS | Status: AC
Start: 2022-02-04 — End: 2022-02-04
  Administered 2022-02-03: 1000 mL via INTRAVENOUS

## 2022-02-03 NOTE — ED Triage Notes (Signed)
Pt arrives unresponsive on cpap per ems pt was on his porch and talking  upon their arrival. Hx of cancer.

## 2022-02-03 NOTE — Progress Notes (Signed)
Patient initially placed on bipap while preparing for intubation per Dr Joni Fears. See flowsheet. Intubated and placed on ventilator.  Patient tolerated interventions well.

## 2022-02-03 NOTE — Progress Notes (Signed)
PHARMACY -  BRIEF ANTIBIOTIC NOTE   Pharmacy has received consult(s) for Cefepime from an OR provider.  The patient's profile has been reviewed for ht/wt/allergies/indication/available labs.    One time order(s) placed for Cefepime 2 gm  Further antibiotics/pharmacy consults should be ordered by admitting physician if indicated.                       Thank you, Renda Rolls, PharmD, [facility] 02/03/2022 11:11 PM

## 2022-02-03 NOTE — ED Provider Notes (Signed)
[facility] Provider Note    Event Date/Time   First MD Initiated Contact with Patient 02/03/22 2216     (approximate)   History   Chief Complaint: Respiratory Distress   HPI  Arthur Freeman is a 61 y.o. male with a history of hypertension, emphysema, stage IV lung cancer who is brought to the ED by EMS due to respiratory distress.  Per EMS, patient called 911 due to shortness of breath.  When they arrived, he met them on his porch and walk to the ambulance.  However, during transport he became increasingly somnolent with respiratory distress.  He arrives on 15 L nonrebreather with oxygen saturation of 88%.  Patient is not able to communicate on arrival due to critical illness.     Physical Exam   Triage Vital Signs: ED Triage Vitals  Enc Vitals Group     BP 02/03/22 2227 (!) 213/122     Pulse Rate 02/03/22 2227 92     Resp 02/03/22 2227 19     Temp 02/03/22 2249 97.6 F (36.4 C)     Temp src --      SpO2 02/03/22 2227 99 %     Weight 02/03/22 2200 121 lb 11.1 oz (55.2 kg)     Height --      Head Circumference --      Peak Flow --      Pain Score --      Pain Loc --      Pain Edu? --      Excl. in [place]? --     Most recent vital signs: Vitals:   02/04/22 0015 02/04/22 0021  BP: (!) 82/59 103/74  Pulse: 89   Resp: 19 19  Temp: 98.3 F (36.8 C) 98.2 F (36.8 C)  SpO2: 100% 100%    General: Obtunded.  Agonal breathing. CV:  Good peripheral perfusion.  Tachycardia heart rate 100 Resp:  Agonal respirations, diffuse crackles.  Diminished breath sounds at bilateral bases. Abd:  No distention.  Other:  Drooling.  Pupils equal.   ED Results / Procedures / Treatments   Labs (all labs ordered are listed, but only abnormal results are displayed) Labs Reviewed  BRAIN NATRIURETIC PEPTIDE - Abnormal; Notable for the following components:      Result Value   B Natriuretic Peptide 144.6 (*)    All other components within normal limits   LACTIC ACID, PLASMA - Abnormal; Notable for the following components:   Lactic Acid, Venous 2.1 (*)    All other components within normal limits  CBC WITH DIFFERENTIAL/PLATELET - Abnormal; Notable for the following components:   WBC 12.2 (*)    RBC 3.83 (*)    Hemoglobin 11.7 (*)    HCT 37.3 (*)    RDW 20.4 (*)    Neutro Abs 8.1 (*)    Monocytes Absolute 1.7 (*)    Abs Immature Granulocytes 0.12 (*)    All other components within normal limits  BLOOD GAS, ARTERIAL - Abnormal; Notable for the following components:   pH, Arterial 7.30 (*)    pCO2 arterial 57 (*)    pO2, Arterial 109 (*)    All other components within normal limits  URINALYSIS, ROUTINE W REFLEX MICROSCOPIC - Abnormal; Notable for the following components:   Color, Urine YELLOW (*)    APPearance CLEAR (*)    Protein, ur 100 (*)    Bacteria, UA RARE (*)    All other components within normal  limits  BASIC METABOLIC PANEL - Abnormal; Notable for the following components:   Sodium 126 (*)    Potassium 2.9 (*)    Chloride 91 (*)    Glucose, Bld 141 (*)    Calcium 8.6 (*)    All other components within normal limits  CBG MONITORING, ED - Abnormal; Notable for the following components:   Glucose-Capillary 177 (*)    All other components within normal limits  TROPONIN I (HIGH SENSITIVITY) - Abnormal; Notable for the following components:   Troponin I (High Sensitivity) 35 (*)    All other components within normal limits  SARS CORONAVIRUS 2 BY RT PCR  CULTURE, BLOOD (ROUTINE X 2)  CULTURE, BLOOD (ROUTINE X 2)  URINE CULTURE  RESPIRATORY PANEL BY PCR  PROTIME-INR  LIPASE, BLOOD  LACTIC ACID, PLASMA  PROCALCITONIN  CBC  BLOOD GAS, ARTERIAL  MAGNESIUM  PHOSPHORUS  TROPONIN I (HIGH SENSITIVITY)     EKG Interpreted by me Sinus tachycardia rate 134.  Normal axis, normal intervals.  Normal QRS ST segments and T waves.  No acute ischemic changes.   RADIOLOGY Chest x-ray interpreted by me, shows right lung  mass as well as pulmonary edema.  Radiology report reviewed.   PROCEDURES:  .Critical Care  Performed by: Carrie Mew, MD Authorized by: Carrie Mew, MD   Critical care provider statement:    Critical care time (minutes):  40   Critical care time was exclusive of:  Separately billable procedures and treating other patients   Critical care was necessary to treat or prevent imminent or life-threatening deterioration of the following conditions:  Sepsis, respiratory failure, cardiac failure, CNS failure or compromise and shock   Critical care was time spent personally by me on the following activities:  Development of treatment plan with patient or surrogate, discussions with consultants, evaluation of patient's response to treatment, examination of patient, obtaining history from patient or surrogate, ordering and performing treatments and interventions, ordering and review of laboratory studies, ordering and review of radiographic studies, pulse oximetry, re-evaluation of patient's condition and review of old charts   Care discussed with: admitting provider   Comments:        Procedure Name: Intubation Date/Time: 02/04/2022 12:43 AM  Performed by: Carrie Mew, MDPre-anesthesia Checklist: Patient identified, Patient being monitored, Emergency Drugs available, Timeout performed and Suction available Oxygen Delivery Method: Non-rebreather mask Preoxygenation: Pre-oxygenation with 100% oxygen Induction Type: Rapid sequence Ventilation: Mask ventilation without difficulty Laryngoscope Size: Glidescope and 4 Grade View: Grade I Tube size: 7.5 mm Number of attempts: 1 Airway Equipment and Method: Video-laryngoscopy Placement Confirmation: ETT inserted through vocal cords under direct vision, CO2 detector and Breath sounds checked- equal and bilateral Secured at: 25 cm Tube secured with: ETT holder Dental Injury: Teeth and Oropharynx as per pre-operative assessment         MEDICATIONS ORDERED IN ED: Medications  fentaNYL 253mg in NS 2579m(1038mml) infusion-PREMIX (75 mcg/hr Intravenous Rate/Dose Change 02/04/22 0022)  propofol (DIPRIVAN) 1000 MG/100ML infusion (30 mcg/kg/min  59.1 kg Intravenous New Bag/Given 02/03/22 2232)  fentaNYL (SUBLIMAZE) bolus via infusion 100 mcg (100 mcg Intravenous Bolus from Bag 02/04/22 0000)  docusate (COLACE) 50 MG/5ML liquid 100 mg (has no administration in time range)  polyethylene glycol (MIRALAX / GLYCOLAX) packet 17 g (has no administration in time range)  enoxaparin (LOVENOX) injection 40 mg (has no administration in time range)  pantoprazole (PROTONIX) 2 mg/mL oral suspension 40 mg (has no administration in time range)  docusate (COLACE) 50  MG/5ML liquid 100 mg (has no administration in time range)  polyethylene glycol (MIRALAX / GLYCOLAX) packet 17 g (has no administration in time range)  midazolam (VERSED) injection 2 mg (has no administration in time range)  midazolam (VERSED) injection 2 mg (has no administration in time range)  propofol (DIPRIVAN) 10 mg/mL bolus/IV push 110.4 mg (120 mg Intravenous Given 02/03/22 2232)  rocuronium bromide 10 mg/mL (PF) syringe (60 mg Intravenous Given 02/03/22 2236)  ceFEPIme (MAXIPIME) 2 g in sodium chloride 0.9 % 100 mL IVPB (0 g Intravenous Stopped 02/04/22 0003)  azithromycin (ZITHROMAX) 500 mg in sodium chloride 0.9 % 250 mL IVPB (0 mg Intravenous Stopped 02/04/22 0034)  lactated ringers bolus 1,000 mL (0 mLs Intravenous Stopped 02/04/22 0038)  PHENYLephrine 80 mcg/ml in normal saline Adult IV Push Syringe (For Blood Pressure Support) (80 mcg Intravenous Given 02/03/22 2351)  PHENYLephrine 80 mcg/ml in normal saline Adult IV Push Syringe (For Blood Pressure Support) (160 mcg Intravenous Given 02/03/22 2324)     IMPRESSION / MDM / ASSESSMENT AND PLAN / ED COURSE  I reviewed the triage vital signs and the nursing notes.                              Differential  diagnosis includes, but is not limited to, pulmonary edema, pleural effusion, pneumonia, sepsis, non-STEMI, acute CHF  Patient's presentation is most consistent with acute presentation with potential threat to life or bodily function.  Patient presents with respiratory distress, agonal breathing on arrival with altered mental status.  Unable to maintain his airway.  He initially had profound hypoxia and was preoxygenated using BiPAP while preparing for definitive airway.  Laryngoscopy revealed foamy frothy airway secretions consistent with pulmonary edema.  He was intubated easily, and subsequently had hypotension which I think is due to combination of fentanyl propofol and rocuronium.  He was given push doses of phenylephrine and a fluid bolus with improvement of his blood pressure.  Cefepime and azithromycin ordered for pneumonia coverage, though his pulmonary edema and respiratory failure mandate a fluid restrictive strategy to resuscitation.  Care discussed with the patient's grandson who is arrived to the ED.  Case discussed with ICU team for further management.       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   Carrie Mew, MD 02/04/22 [phone]

## 2022-02-03 NOTE — ED Notes (Signed)
RT obtained ABG from right radial artery.

## 2022-02-03 NOTE — Sepsis Progress Note (Signed)
Elink following code sepsis °

## 2022-02-03 NOTE — Progress Notes (Signed)
CODE SEPSIS - PHARMACY COMMUNICATION  **Broad Spectrum Antibiotics should be administered within 1 hour of Sepsis diagnosis**  Time Code Sepsis Called/Page Received: 2318  Antibiotics Ordered: Cefepime & Azithromycin  Time of 1st antibiotic administration: [place], PharmD, [facility] 02/04/2022 12:16 AM

## 2022-02-04 ENCOUNTER — Inpatient Hospital Stay (HOSPITAL_COMMUNITY)
Admit: 2022-02-04 | Discharge: 2022-02-04 | Disposition: A | Discharge status: Home / Self Care | Payer: Medicaid Other | Attending: Pulmonary Disease | Admitting: Pulmonary Disease

## 2022-02-04 DIAGNOSIS — Z66 Do not resuscitate: Secondary | ICD-10-CM | POA: Diagnosis not present

## 2022-02-04 DIAGNOSIS — E871 Hypo-osmolality and hyponatremia: Secondary | ICD-10-CM | POA: Diagnosis present

## 2022-02-04 DIAGNOSIS — C3431 Malignant neoplasm of lower lobe, right bronchus or lung: Secondary | ICD-10-CM | POA: Diagnosis present

## 2022-02-04 DIAGNOSIS — Z20822 Contact with and (suspected) exposure to covid-19: Secondary | ICD-10-CM | POA: Diagnosis present

## 2022-02-04 DIAGNOSIS — I428 Other cardiomyopathies: Secondary | ICD-10-CM | POA: Diagnosis not present

## 2022-02-04 DIAGNOSIS — C3491 Malignant neoplasm of unspecified part of right bronchus or lung: Secondary | ICD-10-CM | POA: Diagnosis not present

## 2022-02-04 DIAGNOSIS — I248 Other forms of acute ischemic heart disease: Secondary | ICD-10-CM | POA: Diagnosis present

## 2022-02-04 DIAGNOSIS — Z7189 Other specified counseling: Secondary | ICD-10-CM | POA: Diagnosis not present

## 2022-02-04 DIAGNOSIS — J9602 Acute respiratory failure with hypercapnia: Secondary | ICD-10-CM | POA: Diagnosis present

## 2022-02-04 DIAGNOSIS — I959 Hypotension, unspecified: Secondary | ICD-10-CM | POA: Diagnosis present

## 2022-02-04 DIAGNOSIS — J9600 Acute respiratory failure, unspecified whether with hypoxia or hypercapnia: Secondary | ICD-10-CM | POA: Diagnosis present

## 2022-02-04 DIAGNOSIS — J189 Pneumonia, unspecified organism: Secondary | ICD-10-CM | POA: Diagnosis present

## 2022-02-04 DIAGNOSIS — J810 Acute pulmonary edema: Secondary | ICD-10-CM

## 2022-02-04 DIAGNOSIS — E43 Unspecified severe protein-calorie malnutrition: Secondary | ICD-10-CM | POA: Diagnosis present

## 2022-02-04 DIAGNOSIS — A419 Sepsis, unspecified organism: Secondary | ICD-10-CM | POA: Diagnosis present

## 2022-02-04 DIAGNOSIS — R64 Cachexia: Secondary | ICD-10-CM | POA: Diagnosis present

## 2022-02-04 DIAGNOSIS — J439 Emphysema, unspecified: Secondary | ICD-10-CM | POA: Diagnosis present

## 2022-02-04 DIAGNOSIS — F064 Anxiety disorder due to known physiological condition: Secondary | ICD-10-CM | POA: Diagnosis present

## 2022-02-04 DIAGNOSIS — I160 Hypertensive urgency: Secondary | ICD-10-CM | POA: Diagnosis present

## 2022-02-04 DIAGNOSIS — J9601 Acute respiratory failure with hypoxia: Secondary | ICD-10-CM | POA: Diagnosis present

## 2022-02-04 DIAGNOSIS — C7972 Secondary malignant neoplasm of left adrenal gland: Secondary | ICD-10-CM | POA: Diagnosis present

## 2022-02-04 DIAGNOSIS — C7951 Secondary malignant neoplasm of bone: Secondary | ICD-10-CM | POA: Diagnosis present

## 2022-02-04 DIAGNOSIS — C7971 Secondary malignant neoplasm of right adrenal gland: Secondary | ICD-10-CM | POA: Diagnosis present

## 2022-02-04 DIAGNOSIS — E8809 Other disorders of plasma-protein metabolism, not elsewhere classified: Secondary | ICD-10-CM | POA: Diagnosis present

## 2022-02-04 DIAGNOSIS — G9341 Metabolic encephalopathy: Secondary | ICD-10-CM | POA: Diagnosis present

## 2022-02-04 DIAGNOSIS — Z515 Encounter for palliative care: Secondary | ICD-10-CM | POA: Diagnosis not present

## 2022-02-04 DIAGNOSIS — Z681 Body mass index (BMI) 19 or less, adult: Secondary | ICD-10-CM | POA: Diagnosis not present

## 2022-02-04 LAB — TROPONIN I (HIGH SENSITIVITY)
Troponin I (High Sensitivity): 178 ng/L (ref <18)
Troponin I (High Sensitivity): 242 ng/L (ref <18)
Troponin I (High Sensitivity): 258 ng/L (ref <18)
Troponin I (High Sensitivity): 231 ng/L (ref <18)
Troponin I (High Sensitivity): 166 ng/L (ref <18)

## 2022-02-04 LAB — RESPIRATORY PANEL BY PCR

## 2022-02-04 LAB — ETHANOL: Alcohol, Ethyl (B): <10 mg/dL (ref <10)

## 2022-02-04 LAB — CBC
HCT: 25.7 % — ABNORMAL LOW (ref 39.0–52.0)
HCT: 26.4 % — ABNORMAL LOW (ref 39.0–52.0)
Hemoglobin: 8.4 g/dL — ABNORMAL LOW (ref 13.0–17.0)
Hemoglobin: 8.6 g/dL — ABNORMAL LOW (ref 13.0–17.0)
MCH: 30.9 pg (ref 26.0–34.0)
MCH: 31.2 pg (ref 26.0–34.0)
MCHC: 32.7 g/dL (ref 30.0–36.0)
MCHC: 32.6 g/dL (ref 30.0–36.0)
MCV: 94.5 fL (ref 80.0–100.0)
MCV: 95.7 fL (ref 80.0–100.0)
Platelets: 198 10*3/uL (ref 150–400)
Platelets: 195 10*3/uL (ref 150–400)
RBC: 2.72 MIL/uL — ABNORMAL LOW (ref 4.22–5.81)
RBC: 2.76 MIL/uL — ABNORMAL LOW (ref 4.22–5.81)
RDW: 19.9 % — ABNORMAL HIGH (ref 11.5–15.5)
RDW: 19.9 % — ABNORMAL HIGH (ref 11.5–15.5)
WBC: 14 10*3/uL — ABNORMAL HIGH (ref 4.0–10.5)
WBC: 12.8 10*3/uL — ABNORMAL HIGH (ref 4.0–10.5)
nRBC: 0 % (ref 0.0–0.2)
nRBC: 0 % (ref 0.0–0.2)

## 2022-02-04 LAB — URINE DRUG SCREEN, QUALITATIVE (ARMC ONLY)
Amphetamines, Ur Screen: NOT DETECTED
Barbiturates, Ur Screen: NOT DETECTED
Benzodiazepine, Ur Scrn: NOT DETECTED
Cannabinoid 50 Ng, Ur ~~LOC~~: NOT DETECTED
Cocaine Metabolite,Ur ~~LOC~~: NOT DETECTED
MDMA (Ecstasy)Ur Screen: NOT DETECTED
Methadone Scn, Ur: NOT DETECTED
Opiate, Ur Screen: NOT DETECTED
Phencyclidine (PCP) Ur S: NOT DETECTED
Tricyclic, Ur Screen: NOT DETECTED

## 2022-02-04 LAB — HEPATIC FUNCTION PANEL
ALT: 17 U/L (ref 0–44)
AST: 29 U/L (ref 15–41)
Albumin: 2.9 g/dL — ABNORMAL LOW (ref 3.5–5.0)
Alkaline Phosphatase: 84 U/L (ref 38–126)
Bilirubin, Direct: <0.1 mg/dL (ref 0.0–0.2)
Total Bilirubin: 0.3 mg/dL (ref 0.3–1.2)
Total Protein: 6.6 g/dL (ref 6.5–8.1)

## 2022-02-04 LAB — ECHOCARDIOGRAM COMPLETE
AR max vel: 1.89 cm2
AV Area VTI: 2.36 cm2
AV Area mean vel: 2 cm2
AV Mean grad: 4 mmHg
AV Peak grad: 7.7 mmHg
Ao pk vel: 1.39 m/s
Area-P 1/2: 3.03 cm2
Height: 70 in
S' Lateral: 2.97 cm
Weight: 2105.83 oz

## 2022-02-04 LAB — BLOOD GAS, ARTERIAL
Acid-Base Excess: 1.9 mmol/L (ref 0.0–2.0)
Bicarbonate: 28.2 mmol/L — ABNORMAL HIGH (ref 20.0–28.0)
FIO2: 50 %
MECHVT: 450 mL
Mechanical Rate: 18
O2 Saturation: 98.4 %
PEEP: 5 cmH2O
Patient temperature: 37
pCO2 arterial: 50 mmHg — ABNORMAL HIGH (ref 32–48)
pH, Arterial: 7.36 (ref 7.35–7.45)
pO2, Arterial: 106 mmHg (ref 83–108)

## 2022-02-04 LAB — BASIC METABOLIC PANEL
Anion gap: 6 (ref 5–15)
Anion gap: 9 (ref 5–15)
BUN: 14 mg/dL (ref 8–23)
BUN: 15 mg/dL (ref 8–23)
CO2: 26 mmol/L (ref 22–32)
CO2: 28 mmol/L (ref 22–32)
Calcium: 8.6 mg/dL — ABNORMAL LOW (ref 8.9–10.3)
Calcium: 8.6 mg/dL — ABNORMAL LOW (ref 8.9–10.3)
Chloride: 91 mmol/L — ABNORMAL LOW (ref 98–111)
Chloride: 92 mmol/L — ABNORMAL LOW (ref 98–111)
Creatinine, Ser: 0.63 mg/dL (ref 0.61–1.24)
Creatinine, Ser: 0.74 mg/dL (ref 0.61–1.24)
GFR, Estimated: >60 mL/min (ref >60)
GFR, Estimated: >60 mL/min (ref >60)
Glucose, Bld: 132 mg/dL — ABNORMAL HIGH (ref 70–99)
Glucose, Bld: 141 mg/dL — ABNORMAL HIGH (ref 70–99)
Potassium: 2.9 mmol/L — ABNORMAL LOW (ref 3.5–5.1)
Potassium: 4.7 mmol/L (ref 3.5–5.1)
Sodium: 126 mmol/L — ABNORMAL LOW (ref 135–145)
Sodium: 126 mmol/L — ABNORMAL LOW (ref 135–145)

## 2022-02-04 LAB — SODIUM
Sodium: 128 mmol/L — ABNORMAL LOW (ref 135–145)
Sodium: 128 mmol/L — ABNORMAL LOW (ref 135–145)
Sodium: 132 mmol/L — ABNORMAL LOW (ref 135–145)

## 2022-02-04 LAB — GLUCOSE, CAPILLARY
Glucose-Capillary: 110 mg/dL — ABNORMAL HIGH (ref 70–99)
Glucose-Capillary: 119 mg/dL — ABNORMAL HIGH (ref 70–99)
Glucose-Capillary: 119 mg/dL — ABNORMAL HIGH (ref 70–99)
Glucose-Capillary: 124 mg/dL — ABNORMAL HIGH (ref 70–99)
Glucose-Capillary: 89 mg/dL (ref 70–99)
Glucose-Capillary: 135 mg/dL — ABNORMAL HIGH (ref 70–99)

## 2022-02-04 LAB — SODIUM, URINE, RANDOM: Sodium, Ur: 155 mmol/L

## 2022-02-04 LAB — TSH: TSH: 1.112 u[IU]/mL (ref 0.350–4.500)

## 2022-02-04 LAB — PROTIME-INR
INR: 1 (ref 0.8–1.2)
Prothrombin Time: 13.5 seconds (ref 11.4–15.2)

## 2022-02-04 LAB — LIPASE, BLOOD: Lipase: 31 U/L (ref 11–51)

## 2022-02-04 LAB — CORTISOL: Cortisol, Plasma: 5.8 ug/dL

## 2022-02-04 LAB — PHOSPHORUS
Phosphorus: 5.6 mg/dL — ABNORMAL HIGH (ref 2.5–4.6)
Phosphorus: 4.4 mg/dL (ref 2.5–4.6)

## 2022-02-04 LAB — MAGNESIUM
Magnesium: 1.6 mg/dL — ABNORMAL LOW (ref 1.7–2.4)
Magnesium: 2.1 mg/dL (ref 1.7–2.4)

## 2022-02-04 LAB — LACTIC ACID, PLASMA: Lactic Acid, Venous: 0.7 mmol/L (ref 0.5–1.9)

## 2022-02-04 LAB — SARS CORONAVIRUS 2 BY RT PCR: SARS Coronavirus 2 by RT PCR: NEGATIVE

## 2022-02-04 LAB — OSMOLALITY: Osmolality: 265 mOsm/kg — ABNORMAL LOW (ref 275–295)

## 2022-02-04 LAB — OSMOLALITY, URINE: Osmolality, Ur: 578 mOsm/kg (ref 300–900)

## 2022-02-04 LAB — CREATININE, URINE, RANDOM: Creatinine, Urine: 68 mg/dL

## 2022-02-04 LAB — PROCALCITONIN: Procalcitonin: 0.18 ng/mL

## 2022-02-04 MED ORDER — TRAMADOL HCL 50 MG PO TABS
50.0000 mg | ORAL_TABLET | Freq: Four times a day (QID) | ORAL | Status: DC | PRN
  Administered 2022-02-04: 50 mg via ORAL
  Filled 2022-02-04: qty 1

## 2022-02-04 MED ORDER — BUDESONIDE 0.25 MG/2ML IN SUSP
0.2500 mg | Freq: Two times a day (BID) | RESPIRATORY_TRACT | Status: DC
  Administered 2022-02-04 – 2022-02-06 (×5): 0.25 mg via RESPIRATORY_TRACT
  Filled 2022-02-04 (×5): qty 2

## 2022-02-04 MED ORDER — FOLIC ACID 1 MG PO TABS
1.0000 mg | ORAL_TABLET | Freq: Every day | ORAL | Status: DC
  Administered 2022-02-05 – 2022-02-06 (×2): 1 mg via ORAL
  Filled 2022-02-04 (×2): qty 1

## 2022-02-04 MED ORDER — CHLORHEXIDINE GLUCONATE CLOTH 2 % EX PADS
6.0000 | MEDICATED_PAD | Freq: Every day | CUTANEOUS | Status: DC
  Administered 2022-02-04 – 2022-02-05 (×2): 6 via TOPICAL

## 2022-02-04 MED ORDER — POTASSIUM CHLORIDE 10 MEQ/100ML IV SOLN
10.0000 meq | INTRAVENOUS | Status: AC
  Administered 2022-02-04 (×4): 10 meq via INTRAVENOUS
  Filled 2022-02-04 (×4): qty 100

## 2022-02-04 MED ORDER — ORAL CARE MOUTH RINSE
15.0000 mL | OROMUCOSAL | Status: DC
  Administered 2022-02-04: 15 mL via OROMUCOSAL

## 2022-02-04 MED ORDER — POLYETHYLENE GLYCOL 3350 17 G PO PACK: 17.0000 g | PACK | Freq: Every day | ORAL | Status: DC | PRN

## 2022-02-04 MED ORDER — NOREPINEPHRINE 4 MG/250ML-% IV SOLN: 0.0000 ug/min | INTRAVENOUS | Status: DC

## 2022-02-04 MED ORDER — CEFEPIME HCL 2 G IV SOLR
2.0000 g | Freq: Three times a day (TID) | INTRAVENOUS | Status: DC
  Administered 2022-02-04 – 2022-02-06 (×8): 2 g via INTRAVENOUS
  Filled 2022-02-04 (×2): qty 12.5
  Filled 2022-02-04: qty 2
  Filled 2022-02-04: qty 12.5
  Filled 2022-02-04 (×4): qty 2
  Filled 2022-02-04 (×2): qty 12.5

## 2022-02-04 MED ORDER — MIDAZOLAM HCL 2 MG/2ML IJ SOLN: 2.0000 mg | INTRAMUSCULAR | Status: DC | PRN

## 2022-02-04 MED ORDER — ORAL CARE MOUTH RINSE: 15.0000 mL | OROMUCOSAL | Status: DC | PRN

## 2022-02-04 MED ORDER — METHYLPREDNISOLONE SODIUM SUCC 40 MG IJ SOLR
20.0000 mg | Freq: Two times a day (BID) | INTRAMUSCULAR | Status: DC
  Administered 2022-02-04 – 2022-02-05 (×2): 20 mg via INTRAVENOUS
  Filled 2022-02-04 (×2): qty 1

## 2022-02-04 MED ORDER — ALBUMIN HUMAN 25 % IV SOLN
25.0000 g | Freq: Four times a day (QID) | INTRAVENOUS | Status: AC
  Administered 2022-02-04 (×2): 25 g via INTRAVENOUS
  Filled 2022-02-04 (×2): qty 100

## 2022-02-04 MED ORDER — IPRATROPIUM-ALBUTEROL 0.5-2.5 (3) MG/3ML IN SOLN
3.0000 mL | Freq: Four times a day (QID) | RESPIRATORY_TRACT | Status: DC | PRN
  Administered 2022-02-05 – 2022-02-06 (×5): 3 mL via RESPIRATORY_TRACT
  Filled 2022-02-04 (×6): qty 3

## 2022-02-04 MED ORDER — SODIUM CHLORIDE 0.9 % IV BOLUS
500.0000 mL | Freq: Once | INTRAVENOUS | Status: AC
  Administered 2022-02-04: 500 mL via INTRAVENOUS

## 2022-02-04 MED ORDER — FOLIC ACID 1 MG PO TABS
1.0000 mg | ORAL_TABLET | Freq: Every day | ORAL | Status: DC
Start: 2022-02-05 — End: 2022-02-04

## 2022-02-04 MED ORDER — DOCUSATE SODIUM 100 MG PO CAPS
100.0000 mg | ORAL_CAPSULE | Freq: Two times a day (BID) | ORAL | Status: DC
Start: 2022-02-04 — End: 2022-02-04
  Administered 2022-02-04: 100 mg via ORAL
  Filled 2022-02-04: qty 1

## 2022-02-04 MED ORDER — PANTOPRAZOLE 2 MG/ML SUSPENSION
40.0000 mg | Freq: Every day | ORAL | Status: DC
Start: 2022-02-04 — End: 2022-02-04
  Administered 2022-02-04: 40 mg
  Filled 2022-02-04: qty 20

## 2022-02-04 MED ORDER — THIAMINE MONONITRATE 100 MG PO TABS
100.0000 mg | ORAL_TABLET | Freq: Every day | ORAL | Status: DC
  Administered 2022-02-05 – 2022-02-06 (×2): 100 mg via ORAL
  Filled 2022-02-04 (×2): qty 1

## 2022-02-04 MED ORDER — DOCUSATE SODIUM 100 MG PO CAPS
100.0000 mg | ORAL_CAPSULE | Freq: Two times a day (BID) | ORAL | Status: DC | PRN
Start: 2022-02-04 — End: 2022-02-06

## 2022-02-04 MED ORDER — ENOXAPARIN SODIUM 40 MG/0.4ML IJ SOSY
40.0000 mg | PREFILLED_SYRINGE | INTRAMUSCULAR | Status: DC
Start: 2022-02-04 — End: 2022-02-06
  Administered 2022-02-04 – 2022-02-06 (×3): 40 mg via SUBCUTANEOUS
  Filled 2022-02-04 (×3): qty 0.4

## 2022-02-04 MED ORDER — POLYETHYLENE GLYCOL 3350 17 G PO PACK: 17.0000 g | PACK | Freq: Every day | ORAL | Status: DC

## 2022-02-04 MED ORDER — VITAL AF 1.2 CAL PO LIQD
1000.0000 mL | ORAL | Status: DC
  Administered 2022-02-04: 1000 mL

## 2022-02-04 MED ORDER — FENTANYL BOLUS VIA INFUSION
50.0000 ug | INTRAVENOUS | Status: DC | PRN
  Administered 2022-02-04: 50 ug via INTRAVENOUS

## 2022-02-04 MED ORDER — PANTOPRAZOLE SODIUM 40 MG PO TBEC
40.0000 mg | DELAYED_RELEASE_TABLET | Freq: Every day | ORAL | Status: DC
  Administered 2022-02-05 – 2022-02-06 (×2): 40 mg via ORAL
  Filled 2022-02-04 (×2): qty 1

## 2022-02-04 MED ORDER — FREE WATER
30.0000 mL | Status: DC
  Administered 2022-02-04: 30 mL

## 2022-02-04 MED ORDER — DOCUSATE SODIUM 50 MG/5ML PO LIQD
100.0000 mg | Freq: Two times a day (BID) | ORAL | Status: DC
  Administered 2022-02-04: 100 mg
  Filled 2022-02-04 (×2): qty 10

## 2022-02-04 MED ORDER — POTASSIUM CHLORIDE 20 MEQ PO PACK
40.0000 meq | PACK | Freq: Once | ORAL | Status: AC
  Administered 2022-02-04: 40 meq
  Filled 2022-02-04: qty 2

## 2022-02-04 MED ORDER — POLYETHYLENE GLYCOL 3350 17 G PO PACK
17.0000 g | PACK | Freq: Every day | ORAL | Status: DC
  Administered 2022-02-04: 17 g
  Filled 2022-02-04: qty 1

## 2022-02-04 MED ORDER — THIAMINE MONONITRATE 100 MG PO TABS
100.0000 mg | ORAL_TABLET | Freq: Every day | ORAL | Status: DC
  Administered 2022-02-04: 100 mg
  Filled 2022-02-04 (×2): qty 1

## 2022-02-04 MED ORDER — DOCUSATE SODIUM 50 MG/5ML PO LIQD: 100.0000 mg | Freq: Two times a day (BID) | ORAL | Status: DC | PRN

## 2022-02-04 MED ORDER — FOLIC ACID 1 MG PO TABS
500.0000 ug | ORAL_TABLET | Freq: Every day | ORAL | Status: DC
Start: 2022-02-04 — End: 2022-02-04
  Administered 2022-02-04: 0.5 mg
  Filled 2022-02-04: qty 1

## 2022-02-04 MED ORDER — MAGNESIUM SULFATE 4 GM/100ML IV SOLN
4.0000 g | Freq: Once | INTRAVENOUS | Status: AC
Start: 2022-02-04 — End: 2022-02-04
  Administered 2022-02-04: 4 g via INTRAVENOUS
  Filled 2022-02-04: qty 100

## 2022-02-04 MED ORDER — SODIUM CHLORIDE 0.9 % IV SOLN
100.0000 mg | Freq: Two times a day (BID) | INTRAVENOUS | Status: DC
  Administered 2022-02-04 – 2022-02-06 (×6): 100 mg via INTRAVENOUS
  Filled 2022-02-04 (×8): qty 100

## 2022-02-04 NOTE — ED Notes (Signed)
Pt coughing, secretions suctions. White frothy secretions large amount suctioned.

## 2022-02-04 NOTE — Progress Notes (Signed)
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  He is brought in by EMS due to respiratory distress. He became increasingly lethargic enroute and was eventually intubated. BP initially 213/122, frothy secretions during intubation.  Seen intubated on PRVC 18/450/50%/5 PEEP Peak pressure 23, MV 6  eICU Interventions  Given cefepime and azithromycin for CAP. Continued on doxycycline Possible acute LV dysfunction given vs hypertensive emergency Potassium replaced     Intervention Category Evaluation Type: New Patient Evaluation  Shona Needles Alejandro Gamel 02/04/2022, 2:12 AM

## 2022-02-04 NOTE — Progress Notes (Signed)
Pharmacy Antibiotic Note  Arthur Freeman is a 61 y.o. male admitted on 02/03/2022 with HCAP.  Pharmacy has been consulted for Cefepime dosing.  Plan: Cefepime 2 gm q8hr per indication & renal fxn  Pharmacy will continue to follow and will adjust abx dosing whenever warranted.  Weight: 59.7 kg (131 lb 9.8 oz)  Temp (24hrs), Avg:98.4 F (36.9 C), Min:97.5 F (36.4 C), Max:98.9 F (37.2 C)   Recent Labs  Lab 02/03/22 2243 02/03/22 2356 02/04/22 0056  WBC 12.2*  --   --   CREATININE  --  0.74  --   LATICACIDVEN 2.1*  --  0.7    Estimated Creatinine Clearance: 80.8 mL/min (by C-G formula based on SCr of 0.74 mg/dL).    Allergies  Allergen Reactions   Lisinopril    Norvasc [Amlodipine Besylate]     Antimicrobials this admission: 8/27 Azithromycin >> x 1 dose 8/27 Cefepime >>  8/28 Doxycycline >>   Microbiology results: 8/27 BCx: Pending 8/27 UCx: Pending  8/27 PespPanel: Pending   Thank you for allowing pharmacy to be a part of this patient's care.  Renda Rolls, PharmD, [facility] 02/04/2022 3:29 AM

## 2022-02-04 NOTE — IPAL (Addendum)
  Interdisciplinary Goals of Care Family Meeting   Date carried out: 02/04/2022  Location of the meeting:PHONE  Member's involved: Physician and Family Member or next of kin      GOALS OF CARE DISCUSSION  The Clinical status was relayed to family in detail-Daughter [place]  Updated and notified of patients medical condition- Patient remains unresponsive and will not open eyes to command.   Patient is having a weak cough and struggling to remove secretions.   Patient with increased WOB and using accessory muscles to breathe Explained to family course of therapy and the modalities   Patient with Progressive multiorgan failure with a very high probablity of a very minimal chance of meaningful recovery despite all aggressive and optimal medical therapy.  PATIENT REMAINS FULL CODE  Family understands the situation. Severe COPD exacerbation END STAGE LUNG CANCER CHECK UDS and ETOH LEVELS  Family are satisfied with Plan of action and management. All questions answered  Additional CC time 35 mins   Jordie Schreur Patricia Pesa, M.D.  [facility]  Medical Director [facility] Department

## 2022-02-04 NOTE — Progress Notes (Signed)
Initial Nutrition Assessment  DOCUMENTATION CODES:   Severe malnutrition in context of chronic illness  INTERVENTION:   Vital 1.2@55ml /hr- Initiate at 62ml/hr, once tolerating increase by 56ml/hr q 8 hours until goal rate is reached.   Free water flushes 64ml q4 hours to maintain tube patency   Regimen provides 1584kcal/day, 99g/day protein and 1261ml/day of free water  Folic acid and thiamine po daily   Pt at high refeed risk; recommend monitor potassium, magnesium and phosphorus labs daily until stable  NUTRITION DIAGNOSIS:   Severe Malnutrition related to chronic illness (lung cancer, COPD) as evidenced by severe fat depletion, severe muscle depletion.  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  Pt sedated and ventilated. OGT in place. KUB reports "stomach is distended with air. No free air is seen. Obstructive changes are noted." MD ok to start tube feeds today. Will initiate trickle feeds and advance once tolerating. RD suspects pt with poor oral intake pta r/t cancer treatments. Pt is at high refeed risk. Per chart, pt is down ~41lbs over the past several years but appears to be weight stable since April.   Medications reviewed and include: colace, lovenox, folic acid, solu-medrol, protonix, miralax, thiamine, cefepime, doxycycline, propofol   Labs reviewed: Na 128(L), K 4.7 wnl, P 4.4 wnl, Mg 2.1 wnl Wbc- 12.8(H), Hgb 8.6(L), Hct 26.4(L) Cbgs- 119, 119, 110 x 24 hrs  Patient is currently intubated on ventilator support MV: 7.7 L/min Temp (24hrs), Avg:98.2 F  (36.8 C), Min:97.3 F (36.3 C), Max:99.1 F (37.3 C)  Propofol: 8.87 ml/hr- provides 234kcal/day   MAP- >62mmHg   UOP- 875ml   NUTRITION - FOCUSED PHYSICAL EXAM:  Flowsheet Row Most Recent Value  Orbital Region Moderate depletion  Upper Arm Region Severe depletion  Thoracic and Lumbar Region Severe depletion  Buccal Region Moderate depletion  Temple Region Moderate depletion  Clavicle Bone Region Severe depletion  Clavicle and Acromion Bone Region Severe depletion  Scapular Bone Region Moderate depletion  Dorsal Hand Severe depletion  Patellar Region Severe depletion  Anterior Thigh Region Severe depletion  Posterior Calf Region Severe depletion  Edema (RD Assessment) None  Hair Reviewed  Eyes Reviewed  Mouth Reviewed  Skin Reviewed  Nails Reviewed   Diet Order:   Diet Order             Diet NPO time specified  Diet effective now                  EDUCATION NEEDS:   No education needs have been identified at this time  Skin:  Skin Assessment: Reviewed RN Assessment  Last BM:  PTA  Height:   Ht Readings from Last 1 Encounters:  02/04/22 5\' 10"  (1.778 m)    Weight:   Wt Readings from Last 1 Encounters:  02/04/22 59.7 kg    Ideal Body Weight:  75.45 kg  BMI:  Body mass index is 18.88 kg/m.  Estimated Nutritional Needs:   Kcal:  1607kcal/day  Protein:  90-100g/day  Fluid:  1.8-2.1L/day  Koleen Distance MS, RD, LDN Please refer to [facility] for RD and/or  RD on-call/weekend/after hours pager

## 2022-02-04 NOTE — Sepsis Progress Note (Signed)
Notified provider of need to order additional fluid bolus per sepsis protocol. Was informed that patient may have pulmonary edema and fluids will be minimized as long as BP is ok. Will continue to follow code sepsis.

## 2022-02-04 NOTE — H&P (Signed)
NAME:  Arthur Freeman, MRN:  [id number], DOB:  [birth year], LOS: 0 ADMISSION DATE:  02/03/2022, CONSULTATION DATE:  02/04/22 REFERRING MD:  Dr. Joni Fears, CHIEF COMPLAINT:   Respiratory Distress  History of Present Illness:  61 yo M presenting to [facility] from home via EMS on 02/03/22 after calling 911 for shortness of breath. Per EMS when they arrived the patient met them on the porch and walked to the ambulance. However, during transport the patient became increasingly somnolent with respiratory distress ultimately requiring NRB on 15 L with SpO2 88%. History provided via chart review and via grandson bedside who the patient lives with but rarely sees due to the grandson's work schedule. Yolanda Bonine denies noticing any new or worsening fever/chills, abdominal pain/ nausea/ vomiting. He does report ongoing issues with dyspnea, but that the patient is able to ambulate independently and maintain all ADL's independently. He also reports ongoing issues with right sided pain and that his grandfather still smokes about 1.5 daily. Other than this, the patient's daughter attends all his medical appointments and knows his medicine. Attempted to reach the daughter, Elwin Sleight but was unable to reach her. Per chart review: Patient's Stage IV lung adenocarcinoma was discovered April 2023 with known sites of disease including large RLL mass invading into the bilateral hilum, mediastinum, large bilateral adrenal masses (likely mets), as well as a right scapular lytic lesion concerning for bone mets. He had chemotherapy on 6/9 & 7/5. Then required admission for acute hypoxic respiratory failure and was briefly intubated. Restaging scans relatively stable, MRI negative for brain mets. He is being considered for palliative radiation & possible clinical trial.  ED course: Patient unresponsive on NRB on arrival. He was profoundly hypoxic, acutely hypertensive and emergently intubated on mechanical ventilatory support. Foamy frothy  secretions noted in airway consistent with pulmonary edema. Post intubation hypotensive, initially thought to be from sedatives but lab work revealed lactic acidosis. 1 L LR bolus provided with empiric antibiotics. Lab work also significant for leukocytosis, hyponatremia & hypokalemia, hypomagnesemia, elevated troponin & BNP with decreased osmolality. Medications given: Azithromycin & cefepime, 1 L LR, 500 mL NS, phenylephrine, rocuronium, propofol & fentanyl drips started Initial Vitals: afebrile 98.6, 19, tachycardic- 135, BP-213/122, 100 % on NRB Significant labs: (Labs/ Imaging personally reviewed) I, Domingo Pulse [place], AGACNP-BC, personally viewed and interpreted this ECG. EKG Interpretation: Date: 02/03/22, EKG Time: 22:36, Rate: 134, Rhythm: ST, QRS Axis:  borderline RAD, Intervals: borderline prolonged Qtc,  ST/T Wave abnormalities: minimal ST depression in V5, Narrative Interpretation: ST with minimal ST depression V5, probable LVH, borderline prolonged Qtc Chemistry: Na+: 126, K+: 2.9, Cl: 91, BUN/Cr.: 15/ 0.74, Serum CO2/ AG: 26/9, Mg: 1.6, Phos: 5.6 Hematology: WBC: 12.2, Hgb: 11.7,  Troponin: 35 > 178, BNP: 144.6, Lactic/ PCT: 2.1 > 0.7/ 0.18, INR: 1.0 Serum Osmolality: 265 COVID-19: negative Respiratory panel 20 pathogen: pending ABG: 7.30/ 57/ 109/ 28  CXR 02/03/22: Increased central vessel prominence and mild generalized interstitial edema. Findings consistent with CHF or fluid overload. Trace effusions. Large right lower lobe tumor contiguous with the hilum with pneumonia or lymphangitic carcinomatosis.  PCCM consulted for admission due to acute hypoxic respiratory failure requiring emergent intubation with mechanical ventilatory support.  Pertinent  Medical History  HTN Stage IV lung adenocarcinoma with suspected metastasis to adrenal glands & bone COPD Current everyday smoker (50 pack year history) Former ETOH/marijuana use disorder HCV s/p treatment  Significant  Hospital Events: Including procedures, antibiotic start and stop dates in addition to other pertinent events  02/04/22: Admit to ICU with acute hypoxic respiratory failure requiring emergent intubation and mechanical ventilatory support in the setting of pulmonary edema, Stage IV metastatic lung cancer and possible N-STEMI.  Interim History / Subjective:  Patient intubated and sedated unable to participate in interview. Grandson bedside, all questions answered at this time.  Objective   Blood pressure 103/74, pulse 89, temperature 98.2 F (36.8 C), resp. rate 19, weight 59.1 kg, SpO2 100 %.    Vent Mode: AC FiO2 (%):  [50 %] 50 % Set Rate:  [18 bmp] 18 bmp Vt Set:  [450 mL] 450 mL PEEP:  [5 cmH20] 5 cmH20   Intake/Output Summary (Last 24 hours) at 02/04/2022 0042 Last data filed at 02/04/2022 0038 Gross per 24 hour  Intake 1348.11 ml  Output --  Net 1348.11 ml   Filed Weights   02/03/22 2200 02/03/22 2229  Weight: 55.2 kg 59.1 kg    Examination: General: Adult male, critically ill, lying in bed intubated & sedated requiring mechanical ventilation, NAD HEENT: MM pink/moist, anicteric, atraumatic, neck supple Neuro: intubated and sedated, unable to follow commands, PERRL +3, MAE CV: s1s2 RRR, NSR on monitor, no r/m/g Pulm: Regular, non labored on AC 50% & PEEP 5, breath sounds coarse rhonchi-BUL & diminished-BLL GI: soft, rounded, bs x 4 GU: foley in place with clear yellow urine Skin: limited exam- no rashes/lesions noted Extremities: warm/dry, pulses + 2 R/P, +3 pitting edema noted BLE  Resolved Hospital Problem list     Assessment & Plan:  Acute Hypoxic / Hypercapnic Respiratory Failure secondary to pulmonary edema in the setting of Stage IV metastatic lung adenocarcinoma & COPD Current Everyday smoker - Ventilator settings: PRVC  8 mL/kg, 50 % FiO2, 5 PEEP, continue ventilator support & lung protective strategies - Wean PEEP & FiO2 as tolerated, maintain SpO2 > 90% -  Head of bed elevated 30 degrees, VAP protocol in place - Plateau pressures less than 30 cm H20  - Intermittent chest x-ray & ABG PRN - Daily WUA with SBT as tolerated  - Ensure adequate pulmonary hygiene  - F/u cultures, trend PCT - Continue CAP coverage cefepime & doxycycline - Budesonide nebs BID, bronchodilators PRN - PAD protocol in place: continue Fentanyl drip & Propofol drip - provide smoking cessation counseling once patient has stabilized and has been extubated - palliative consulted to assist with [place] discussions  Suspected Sepsis due to suspected CAP Initial interventions/workup included: 1.5 L of NS/LR & Cefepime/ Azithromycin - Supplemental oxygen as needed, to maintain SpO2 > 90% - f/u cultures, trend lactic/ PCT - Daily CBC, monitor WBC/ fever curve - IV antibiotics: cefepime & doxycycline - Consider norepinephrine to maintain MAP< 65  - Strict I/O's: alert provider if UOP < 0.5 mL/kg/hr  Acute Moderate Hypovolemic vs Edematous - Hypotonic Hyponatremia suspect secondary to over diuresis Baseline Na+: 135 (8/17), Na+ on admission: 126, Urine osmolality: 578, Serum osmolality: 265, Ur Na+: 155, Ur Cr: 68 - careful IVF resuscitation: patient received 1.5 L IVF bolus   - Target Na+: 135, careful not to raise Na+ level more then: 0.5-1 mmol/h OR 8-10 mmol/L in first 24 hours  Elevated BNP Elevated Troponin secondary to N-STEMI vs demand ischemia Hypertensive Urgency- resolved Initial EKG concerning for ST depression in V5. Weight up to 131 from a dry weight of 113 on 01/10/22 - holding further diuresis for now given marginal to hypotensive BP and hypotonic hyponatremia - repeat EKG - Trend troponins  - Echocardiogram ordered - consider heparin drip  depending on EKG and troponin trend - continuous cardiac monitoring  Hypokalemia Hypomagnesemia Hypoalbuminemia - K+, Mg & albumin supplementation - Daily BMP, replace electrolytes PRN  Best Practice (right click and  "Reselect all SmartList Selections" daily)  Diet/type: NPO w/ meds via tube DVT prophylaxis: LMWH GI prophylaxis: PPI Lines: yes and it is still needed Foley:  Yes, and it is still needed Code Status:  full code Last date of multidisciplinary goals of care discussion [02/04/22]  Spoke with his grandson bedside, [place], who is unaware of what the patient's wishes may be as they have not discussed this. The patient's daughter Elwin Sleight attends all appointments with the patient and had been working on [employer] paperwork possibly. [place] provided [employer] number, which I called and left a voicemail. Per [place] the patient is a widow and his other children are not involved, he is unsure where they all are besides Tamika.  Labs   CBC: Recent Labs  Lab 02/03/22 2243  WBC 12.2*  NEUTROABS 8.1*  HGB 11.7*  HCT 37.3*  MCV 97.4  PLT 726    Basic Metabolic Panel: Recent Labs  Lab 02/03/22 2356  NA 126*  K 2.9*  CL 91*  CO2 26  GLUCOSE 141*  BUN 15  CREATININE 0.74  CALCIUM 8.6*   GFR: Estimated Creatinine Clearance: 80 mL/min (by C-G formula based on SCr of 0.74 mg/dL). Recent Labs  Lab 02/03/22 2243  WBC 12.2*  LATICACIDVEN 2.1*    Liver Function Tests: No results for input(s): "AST", "ALT", "ALKPHOS", "BILITOT", "PROT", "ALBUMIN" in the last 168 hours. Recent Labs  Lab 02/03/22 2356  LIPASE 31   No results for input(s): "AMMONIA" in the last 168 hours.  ABG    Component Value Date/Time   PHART 7.30 (L) 02/03/2022 2305   PCO2ART 57 (H) 02/03/2022 2305   PO2ART 109 (H) 02/03/2022 2305   HCO3 28.0 02/03/2022 2305   O2SAT 98.6 02/03/2022 2305     Coagulation Profile: Recent Labs  Lab 02/03/22 2243  INR 1.0    Cardiac Enzymes: No results for input(s): "CKTOTAL", "CKMB", "CKMBINDEX", "TROPONINI" in the last 168 hours.  HbA1C: No results found for: "HGBA1C"  CBG: Recent Labs  Lab 02/03/22 2233  GLUCAP 177*    Review of Systems:   UTA- patient intubated and  sedated  Past Medical History:  He,  has a past medical history of ED (erectile dysfunction), Hypercalcemia, Hypertension, RLS (restless legs syndrome), and Sciatica.   Surgical History:  No past surgical history on file.   Social History:   reports that he has been smoking cigarettes. He has been smoking an average of 1 pack per day. He has never used smokeless tobacco. He reports current alcohol use. He reports current drug use. Drug: Marijuana.   Family History:  His family history is not on file.   Allergies Allergies  Allergen Reactions   Lisinopril    Norvasc [Amlodipine Besylate]      Home Medications  Prior to Admission medications   Medication Sig Start Date End Date Taking? Authorizing Provider  aspirin 81 MG chewable tablet Chew by mouth. 01/25/22 02/24/22 Yes [provider]  carvedilol (COREG) 12.5 MG tablet Take 12.5 mg by mouth 2 (two) times daily. 01/14/22  Yes [provider]  dexamethasone (DECADRON) 4 MG tablet Take 8 mg by mouth as directed. Take 2 tablets daily on the day before and day after chemo   Yes [provider]  fluticasone-salmeterol (ADVAIR) 250-50 MCG/ACT AEPB Inhale into  the lungs. 01/30/22 01/30/23 Yes [provider]  folic acid (FOLVITE) 014 MCG tablet Take 400 mcg by mouth daily.   Yes [provider]  hydrochlorothiazide (HYDRODIURIL) 25 MG tablet Take 25 mg by mouth daily. 12/03/21  Yes [provider]  losartan (COZAAR) 100 MG tablet Take 100 mg by mouth daily. 12/03/21  Yes [provider]  OLANZapine (ZYPREXA) 5 MG tablet Take 5 mg by mouth as directed. Take 1 tablet at bedtime on nights 1-4 of chemo cycle 11/12/21  Yes [provider]  ondansetron (ZOFRAN) 8 MG tablet Starting the day after chemotherapy, take one 8 mg tablet every 8 hours for 3 days. Then, take one 8 mg tablet every 8 hours as needed. 11/13/21  Yes [provider]  prochlorperazine (COMPAZINE) 10 MG tablet  Take 10 mg by mouth every 6 (six) hours as needed for nausea or vomiting.   Yes [provider]  SYMBICORT 160-4.5 MCG/ACT inhaler Inhale 2 puffs into the lungs 2 (two) times daily. 11/28/21  Yes [provider]  albuterol (VENTOLIN HFA) 108 (90 Base) MCG/ACT inhaler Inhale 2 puffs into the lungs every 4 (four) hours as needed. 11/28/21   [provider]  ipratropium-albuterol (DUONEB) 0.5-2.5 (3) MG/3ML SOLN Take 3 mLs by nebulization every 4 (four) hours as needed. 12/28/21   [provider]  traMADol (ULTRAM) 50 MG tablet Take 50 mg by mouth every 6 (six) hours as needed for moderate pain. 11/07/21   [provider]     Critical care time: 67 minutes     Venetia Night, AGACNP-BC Acute Care Nurse Practitioner [facility]   [phone] / [phone] Please see Amion for pager details.

## 2022-02-04 NOTE — ED Notes (Signed)
Port not placed at this hospital and not documented in chart. Generic LDA placed and charted as accessed. Blood return noted, flushes without resistance.

## 2022-02-04 NOTE — Consult Note (Signed)
PHARMACY CONSULT NOTE - FOLLOW UP  Pharmacy Consult for Electrolyte Monitoring and Replacement   Recent Labs: Potassium (mmol/L)  Date Value  02/04/2022 4.7   Magnesium (mg/dL)  Date Value  02/04/2022 2.1   Calcium (mg/dL)  Date Value  02/04/2022 8.6 (L)   Albumin (g/dL)  Date Value  02/03/2022 2.9 (L)   Phosphorus (mg/dL)  Date Value  02/04/2022 4.4   Sodium (mmol/L)  Date Value  02/04/2022 126 (L)     Assessment: 61yo M w/ h/o HTN, Stage IV lung CA (on cis/pemetrexed, pembro) w/ c/f metastasis to adrenals and bone, COPD, HCV s/p treatment presenting with respiratory failure 2/2 COPD requiring intubation and transfer to CCU. Pharmacy consulted for mgmt of electrolytes while in CCU.  Goal of Therapy:  Lytes WNL  Plan:  K 2.9>4.7: after KCL 13meq (40PO and 40IV) No further repletion at this time. Mg 1.6>2.1: after MgSO 4g IV x1 No further repletion at this time. Phos 5.6>4.4: WNL, no replacement at this time. Na 126 x2: likely dilutional with wt of 133kg and dry wt of 113. CTM with correction of fluid balance. CTM daily with AM labs and repletion PRN.  Lorna Dibble ,PharmD Clinical Pharmacist 02/04/2022 7:34 AM

## 2022-02-04 NOTE — Procedures (Signed)
Extubation Procedure Note  Patient Details:   Name: Arthur Freeman DOB: Jan 30, 1961 MRN: [id number]   Airway Documentation:    Vent end date: 02/04/22 Vent end time: 1550   Evaluation  O2 sats: stable throughout Complications: No apparent complications Patient did tolerate procedure well.    Yes able to cough and speak  Marlane Mingle 02/04/2022, 4:00 PM

## 2022-02-05 ENCOUNTER — Encounter: Payer: Self-pay | Admitting: Pulmonary Disease

## 2022-02-05 ENCOUNTER — Inpatient Hospital Stay: Payer: Medicaid Other

## 2022-02-05 DIAGNOSIS — E43 Unspecified severe protein-calorie malnutrition: Secondary | ICD-10-CM

## 2022-02-05 DIAGNOSIS — C3491 Malignant neoplasm of unspecified part of right bronchus or lung: Secondary | ICD-10-CM

## 2022-02-05 DIAGNOSIS — Z7189 Other specified counseling: Secondary | ICD-10-CM

## 2022-02-05 DIAGNOSIS — J9601 Acute respiratory failure with hypoxia: Secondary | ICD-10-CM

## 2022-02-05 LAB — CBC WITH DIFFERENTIAL/PLATELET
Abs Immature Granulocytes: 0.06 10*3/uL (ref 0.00–0.07)
Basophils Absolute: 0 10*3/uL (ref 0.0–0.1)
Basophils Relative: 0 %
Eosinophils Absolute: 0 10*3/uL (ref 0.0–0.5)
Eosinophils Relative: 0 %
HCT: 24.5 % — ABNORMAL LOW (ref 39.0–52.0)
Hemoglobin: 7.8 g/dL — ABNORMAL LOW (ref 13.0–17.0)
Immature Granulocytes: 0 %
Lymphocytes Relative: 2 %
Lymphs Abs: 0.3 10*3/uL — ABNORMAL LOW (ref 0.7–4.0)
MCH: 31.2 pg (ref 26.0–34.0)
MCHC: 31.8 g/dL (ref 30.0–36.0)
MCV: 98 fL (ref 80.0–100.0)
Monocytes Absolute: 0.8 10*3/uL (ref 0.1–1.0)
Monocytes Relative: 6 %
Neutro Abs: 12.3 10*3/uL — ABNORMAL HIGH (ref 1.7–7.7)
Neutrophils Relative %: 92 %
Platelets: 198 10*3/uL (ref 150–400)
RBC: 2.5 MIL/uL — ABNORMAL LOW (ref 4.22–5.81)
RDW: 20.5 % — ABNORMAL HIGH (ref 11.5–15.5)
WBC: 13.5 10*3/uL — ABNORMAL HIGH (ref 4.0–10.5)
nRBC: 0 % (ref 0.0–0.2)

## 2022-02-05 LAB — SODIUM
Sodium: 130 mmol/L — ABNORMAL LOW (ref 135–145)
Sodium: 135 mmol/L (ref 135–145)
Sodium: 135 mmol/L (ref 135–145)

## 2022-02-05 LAB — URINE CULTURE: Culture: NO GROWTH

## 2022-02-05 LAB — MAGNESIUM: Magnesium: 2 mg/dL (ref 1.7–2.4)

## 2022-02-05 LAB — BASIC METABOLIC PANEL
Anion gap: 5 (ref 5–15)
BUN: 19 mg/dL (ref 8–23)
CO2: 27 mmol/L (ref 22–32)
Calcium: 9.2 mg/dL (ref 8.9–10.3)
Chloride: 98 mmol/L (ref 98–111)
Creatinine, Ser: 0.68 mg/dL (ref 0.61–1.24)
GFR, Estimated: >60 mL/min (ref >60)
Glucose, Bld: 106 mg/dL — ABNORMAL HIGH (ref 70–99)
Potassium: 4.9 mmol/L (ref 3.5–5.1)
Sodium: 130 mmol/L — ABNORMAL LOW (ref 135–145)

## 2022-02-05 LAB — GLUCOSE, CAPILLARY
Glucose-Capillary: 131 mg/dL — ABNORMAL HIGH (ref 70–99)
Glucose-Capillary: 75 mg/dL (ref 70–99)

## 2022-02-05 LAB — PHOSPHORUS: Phosphorus: 3.3 mg/dL (ref 2.5–4.6)

## 2022-02-05 MED ORDER — ADULT MULTIVITAMIN W/MINERALS CH
1.0000 | ORAL_TABLET | Freq: Every day | ORAL | Status: DC
  Administered 2022-02-06: 1 via ORAL
  Filled 2022-02-05: qty 1

## 2022-02-05 MED ORDER — MORPHINE SULFATE (PF) 2 MG/ML IV SOLN
2.0000 mg | INTRAVENOUS | Status: DC | PRN
  Administered 2022-02-05 – 2022-02-06 (×8): 2 mg via INTRAVENOUS
  Filled 2022-02-05 (×8): qty 1

## 2022-02-05 MED ORDER — FUROSEMIDE 10 MG/ML IJ SOLN
40.0000 mg | Freq: Once | INTRAMUSCULAR | Status: AC
  Administered 2022-02-05: 40 mg via INTRAVENOUS
  Filled 2022-02-05: qty 4

## 2022-02-05 MED ORDER — STERILE WATER FOR INJECTION IJ SOLN
INTRAMUSCULAR | Status: AC
  Administered 2022-02-05: 10 mL
  Filled 2022-02-05: qty 10

## 2022-02-05 MED ORDER — TRAMADOL HCL 50 MG PO TABS
50.0000 mg | ORAL_TABLET | Freq: Four times a day (QID) | ORAL | Status: DC | PRN
  Administered 2022-02-05: 50 mg via ORAL
  Administered 2022-02-06: 100 mg via ORAL
  Administered 2022-02-06: 50 mg via ORAL
  Filled 2022-02-05 (×2): qty 1
  Filled 2022-02-05: qty 2

## 2022-02-05 MED ORDER — OXYCODONE HCL 5 MG PO TABS
5.0000 mg | ORAL_TABLET | Freq: Four times a day (QID) | ORAL | Status: DC | PRN
  Administered 2022-02-05 – 2022-02-06 (×3): 5 mg via ORAL
  Filled 2022-02-05 (×3): qty 1

## 2022-02-05 MED ORDER — NICOTINE 21 MG/24HR TD PT24
21.0000 mg | MEDICATED_PATCH | Freq: Every day | TRANSDERMAL | Status: DC
  Administered 2022-02-05 – 2022-02-06 (×2): 21 mg via TRANSDERMAL
  Filled 2022-02-05 (×3): qty 1

## 2022-02-05 MED ORDER — MORPHINE SULFATE (PF) 2 MG/ML IV SOLN
2.0000 mg | Freq: Once | INTRAVENOUS | Status: AC
  Administered 2022-02-05: 2 mg via INTRAVENOUS
  Filled 2022-02-05: qty 1

## 2022-02-05 MED ORDER — METHYLPREDNISOLONE SODIUM SUCC 40 MG IJ SOLR
40.0000 mg | Freq: Once | INTRAMUSCULAR | Status: AC
  Administered 2022-02-05: 40 mg via INTRAVENOUS
  Filled 2022-02-05: qty 1

## 2022-02-05 MED ORDER — KETOROLAC TROMETHAMINE 15 MG/ML IJ SOLN
15.0000 mg | Freq: Once | INTRAMUSCULAR | Status: AC
Start: 2022-02-05 — End: 2022-02-05
  Administered 2022-02-05: 15 mg via INTRAVENOUS
  Filled 2022-02-05: qty 1

## 2022-02-05 MED ORDER — ENSURE ENLIVE PO LIQD
237.0000 mL | Freq: Three times a day (TID) | ORAL | Status: DC
Start: 2022-02-05 — End: 2022-02-06
  Administered 2022-02-05 (×2): 237 mL via ORAL

## 2022-02-05 MED ORDER — METHYLPREDNISOLONE SODIUM SUCC 40 MG IJ SOLR
20.0000 mg | Freq: Two times a day (BID) | INTRAMUSCULAR | Status: DC
  Administered 2022-02-05 – 2022-02-06 (×3): 20 mg via INTRAVENOUS
  Filled 2022-02-05 (×3): qty 1

## 2022-02-05 NOTE — Consult Note (Signed)
PHARMACY CONSULT NOTE - FOLLOW UP  Pharmacy Consult for Electrolyte Monitoring and Replacement   Recent Labs: Potassium (mmol/L)  Date Value  02/05/2022 4.9   Magnesium (mg/dL)  Date Value  02/05/2022 2.0   Calcium (mg/dL)  Date Value  02/05/2022 9.2   Albumin (g/dL)  Date Value  02/03/2022 2.9 (L)   Phosphorus (mg/dL)  Date Value  02/05/2022 3.3   Sodium (mmol/L)  Date Value  02/05/2022 130 (L)     Assessment: 61yo M w/ h/o HTN, Stage IV lung CA (on cis/pemetrexed, pembro) w/ c/f metastasis to adrenals and bone, COPD, HCV s/p treatment presenting with respiratory failure 2/2 COPD requiring intubation and transfer to CCU. Pharmacy consulted for mgmt of electrolytes while in CCU.  Goal of Therapy:  Lytes WNL  Plan:  Scr stable; UOP 1.41ml/k/h K 4.7>4.9: WNL; No further repletion at this time. Mg 2.1>2.0: WNL; No further repletion at this time. Phos 4.4>3.3: WNL, No replacement at this time. Na 126 >130: likely dilutional with wt of 133kg and dry wt of 113. CTM with correction of fluid balance. CTM daily with AM labs and repletion PRN.  Lorna Dibble ,PharmD Clinical Pharmacist 02/05/2022 7:51 AM

## 2022-02-05 NOTE — Plan of Care (Signed)
  Problem: Education: Goal: Knowledge of General Education information will improve Description: Including pain rating scale, medication(s)/side effects and non-pharmacologic comfort measures Outcome: Progressing   Problem: Clinical Measurements: Goal: Will remain free from infection Outcome: Progressing Goal: Diagnostic test results will improve Outcome: Progressing Goal: Respiratory complications will improve Outcome: Progressing Goal: Cardiovascular complication will be avoided Outcome: Progressing   Problem: Elimination: Goal: Will not experience complications related to urinary retention Outcome: Progressing   Problem: Pain Managment: Goal: General experience of comfort will improve Outcome: Progressing   Problem: Skin Integrity: Goal: Risk for impaired skin integrity will decrease Outcome: Progressing

## 2022-02-05 NOTE — Progress Notes (Signed)
Nutrition Follow Up Note   DOCUMENTATION CODES:   Severe malnutrition in context of chronic illness  INTERVENTION:   Ensure Enlive po TID, each supplement provides 350 kcal and 20 grams of protein.  MVI po daily   Pt at high refeed risk; recommend monitor potassium, magnesium and phosphorus labs daily until stable  NUTRITION DIAGNOSIS:   Severe Malnutrition related to chronic illness (lung cancer, COPD) as evidenced by severe fat depletion, severe muscle depletion.  GOAL:   Patient will meet greater than or equal to 90% of their needs -not met   MONITOR:   PO intake, Supplement acceptance, Labs, Weight trends, Skin, I & O's  ASSESSMENT:   61 y/o male with h/o COPD, Stage IV lung adenocarcinoma with suspected metastasis to adrenal glands & bone s/p chemoradiation, HTN, etoh and marijuana use and hepatitis C who is admitted with COPD exacerbation and possible PNA.  Pt extubated yesterday. Pt initiated on a heart healthy diet today. RD will add supplements and MVI to help pt meet his estimated needs. RD will also liberalize pt's diet. Pt is at high refeed risk. Per chart, pt is up ~10lbs since admit; pt -2.3L on his I & Os.   Medications reviewed and include: lovenox, folic acid, solu-medrol, nicotine, protonix, thiamine, cefepime, doxycycline  Labs reviewed: Na 130(L), K 4.9 wnl, P 3.3 wnl, Mg 2.0 wnl Wbc- 13.5(H), Hgb 7.8(L), Hct 24.5(L) Cbgs- 135, 89, 124, 119 x 48 hrs  Diet Order:   Diet Order             Diet Heart Room service appropriate? Yes; Fluid consistency: Thin  Diet effective now                  EDUCATION NEEDS:   No education needs have been identified at this time  Skin:  Skin Assessment: Reviewed RN Assessment  Last BM:  PTA  Height:   Ht Readings from Last 1 Encounters:  02/04/22 5' 10" (1.778 m)    Weight:   Wt Readings from Last 1 Encounters:  02/04/22 59.7 kg    Ideal Body Weight:  75.45 kg  BMI:  Body mass index is 18.88  kg/m.  Estimated Nutritional Needs:   Kcal:  1800-2100kcal/day  Protein:  90-100g/day  Fluid:  1.8-2.1L/day  Casey Campbell MS, RD, LDN Please refer to AMION for RD and/or RD on-call/weekend/after hours pager 

## 2022-02-05 NOTE — Consult Note (Addendum)
Consultation Note Date: 02/05/2022   Patient Name: Arthur Freeman  DOB: Oct 19, 1960  MRN: [id number]  Age / Sex: 61 y.o., male  PCP: [facility] [place] Referring Physician: Fritzi Mandes, MD  Reason for Consultation: Establishing goals of care  HPI/Patient Profile:  61 yo M presenting to [facility] from home via EMS on 02/03/22 after calling 911 for shortness of breath. Per EMS when they arrived the patient met them on the porch and walked to the ambulance. However, during transport the patient became increasingly somnolent with respiratory distress ultimately requiring NRB on 15 L with SpO2 88%.  Clinical Assessment and Goals of Care: Notes and labs reviewed. In to see patient, he has friends at bedside. Offered to return at another time, and he stated he wanted to talk about his healthcare with them present as they are like family to him.   He lives in his home with his grand son. He states he has been doing experimental therapy with [facility]. He discusses how this has gone for him. He states the chemo did not work, and the current plan is to place "beads" and provide radiation. He states he has been having pain and discomfort. He states he has been having SOB. He explains that the purpose of the treatment is to try to provide more time, but with his suffering, this is not time he wants. He is no longer interested in continuing treatment with [facility].   We discussed his diagnosis, prognosis, GOC, EOL wishes disposition and options.  Created space and opportunity for patient  to explore thoughts and feelings regarding current medical information.   A detailed discussion was had today regarding advanced directives.  Concepts specific to code status, artifical feeding and hydration, IV antibiotics and rehospitalization were discussed.  The difference between an aggressive medical intervention path and  a comfort care path was discussed.  Values and goals of care important to patient and family were attempted to be elicited.  Discussed limitations of medical interventions to prolong quality of life in some situations and discussed the concept of human mortality.  He states he has been having pain and discomfort. He states he has been having SOB. He explains that the purpose of the treatment is to try to provide more time, but with his suffering and SOB, this is not quality time that he wants to prolong. He is no longer interested in continuing treatment with [facility].   Patient states he wants to go home and focus on comfort for the time he has left on this earth. He requests that his symptoms be managed. Attending in to conversation- conversation was discussed.   Plans for family meeting tomorrow morning for patient to speak with children about his plans for home with hospice.   I completed a MOST form today and the signed original was placed in the chart. The form was scanned and sent to medical records for it to be uploaded under ACP tab in Epic. A photocopy was also placed in the  chart to be scanned into EMR. The patient outlined their wishes for the following treatment decisions:  Cardiopulmonary Resuscitation: Do Not Attempt Resuscitation (DNR/No CPR)  Medical Interventions: Limited Additional Interventions: Use medical treatment, IV fluids and cardiac monitoring as indicated, DO NOT USE intubation or mechanical ventilation. May consider use of less invasive airway support such as BiPAP or CPAP. Also provide comfort measures. Transfer to the hospital if indicated. Avoid intensive care.   Antibiotics: No antibiotics (use other measures to relieve symptoms)  IV Fluids: No IV fluids (provide other measures to ensure comfort)  Feeding Tube: No feeding tube     SUMMARY OF RECOMMENDATIONS    Patient wants home with hospice. Will have a formal meeting with his children present tomorrow to let  them know his wishes and plans.   Spoke with primary team regarding symptom management.     Prognosis:  < 6 months       Primary Diagnoses: Present on Admission:  Acute respiratory failure ([place])   I have reviewed the medical record, interviewed the patient and family, and examined the patient. The following aspects are pertinent.  Past Medical History:  Diagnosis Date   ED (erectile dysfunction)    Hypercalcemia    Hypertension    RLS (restless legs syndrome)    Sciatica    Social History   Socioeconomic History   Marital status: Married    Spouse name: Not on file   Number of children: Not on file   Years of education: Not on file   Highest education level: Not on file  Occupational History   Not on file  Tobacco Use   Smoking status: Every Day    Packs/day: 1.00    Types: Cigarettes   Smokeless tobacco: Never  Vaping Use   Vaping Use: Never used  Substance and Sexual Activity   Alcohol use: Yes   Drug use: Yes    Types: Marijuana   Sexual activity: Not on file  Other Topics Concern   Not on file  Social History Narrative   Not on file   Social Determinants of Health   Financial Resource Strain: Not on file  Food Insecurity: Not on file  Transportation Needs: Not on file  Physical Activity: Not on file  Stress: Not on file  Social Connections: Not on file   No family history on file. Scheduled Meds:  budesonide (PULMICORT) nebulizer solution  0.25 mg Nebulization BID   Chlorhexidine Gluconate Cloth  6 each Topical Daily   enoxaparin (LOVENOX) injection  40 mg Subcutaneous Q24H   feeding supplement  237 mL Oral TID BM   folic acid  1 mg Oral Daily   methylPREDNISolone (SOLU-MEDROL) injection  20 mg Intravenous Q12H   [START ON 02/06/2022] multivitamin with minerals  1 tablet Oral Daily   nicotine  21 mg Transdermal Daily   pantoprazole  40 mg Oral Daily   thiamine  100 mg Oral Daily   Continuous Infusions:  ceFEPime (MAXIPIME) IV 2 g  (02/05/22 1300)   doxycycline (VIBRAMYCIN) IV 100 mg (02/05/22 1511)   PRN Meds:.docusate sodium, ipratropium-albuterol, morphine injection, mouth rinse, oxyCODONE, polyethylene glycol, traMADol Medications Prior to Admission:  Prior to Admission medications   Medication Sig Start Date End Date Taking? Authorizing Provider  aspirin 81 MG chewable tablet Chew by mouth. 01/25/22 02/24/22 Yes [provider]  carvedilol (COREG) 12.5 MG tablet Take 12.5 mg by mouth 2 (two) times daily. 01/14/22  Yes [provider]  dexamethasone (DECADRON) 4  MG tablet Take 8 mg by mouth as directed. Take 2 tablets daily on the day before and day after chemo   Yes [provider]  fluticasone-salmeterol (ADVAIR) 250-50 MCG/ACT AEPB Inhale into the lungs. 01/30/22 01/30/23 Yes [provider]  folic acid (FOLVITE) 048 MCG tablet Take 400 mcg by mouth daily.   Yes [provider]  hydrochlorothiazide (HYDRODIURIL) 25 MG tablet Take 25 mg by mouth daily. 12/03/21  Yes [provider]  losartan (COZAAR) 100 MG tablet Take 100 mg by mouth daily. 12/03/21  Yes [provider]  OLANZapine (ZYPREXA) 5 MG tablet Take 5 mg by mouth as directed. Take 1 tablet at bedtime on nights 1-4 of chemo cycle 11/12/21  Yes [provider]  ondansetron (ZOFRAN) 8 MG tablet Starting the day after chemotherapy, take one 8 mg tablet every 8 hours for 3 days. Then, take one 8 mg tablet every 8 hours as needed. 11/13/21  Yes [provider]  prochlorperazine (COMPAZINE) 10 MG tablet Take 10 mg by mouth every 6 (six) hours as needed for nausea or vomiting.   Yes [provider]  SYMBICORT 160-4.5 MCG/ACT inhaler Inhale 2 puffs into the lungs 2 (two) times daily. 11/28/21  Yes [provider]  albuterol (VENTOLIN HFA) 108 (90 Base) MCG/ACT inhaler Inhale 2 puffs into the lungs every 4 (four) hours as needed. 11/28/21   [provider]   ipratropium-albuterol (DUONEB) 0.5-2.5 (3) MG/3ML SOLN Take 3 mLs by nebulization every 4 (four) hours as needed. 12/28/21   [provider]  traMADol (ULTRAM) 50 MG tablet Take 50 mg by mouth every 6 (six) hours as needed for moderate pain. 11/07/21   [provider]   Allergies  Allergen Reactions   Lisinopril    Norvasc [Amlodipine Besylate]    Review of Systems  Respiratory:  Positive for chest tightness and shortness of breath.        Cancer related pain    Physical Exam Pulmonary:     Effort: Pulmonary effort is normal.  Neurological:     Mental Status: He is alert.     Vital Signs: BP (!) 167/99   Pulse (!) 104   Temp 99.3 F (37.4 C)   Resp 17   Ht _0  (1.778 m)   Wt 59.7 kg   SpO2 95%   BMI 18.88 kg/m  Pain Scale: 0-10   Pain Score: 5    SpO2: SpO2: 95 % O2 Device:SpO2: 95 % O2 Flow Rate: .O2 Flow Rate (L/min): 8 L/min  IO: Intake/output summary:  Intake/Output Summary (Last 24 hours) at 02/05/2022 1553 Last data filed at 02/05/2022 0900 Gross per 24 hour  Intake 240 ml  Output 3080 ml  Net -2840 ml    LBM:   Baseline Weight: Weight: 55.2 kg (Office note from 01/24/22) Most recent weight: Weight: 59.7 kg      Signed by: Asencion Gowda, NP   Please contact Palliative Medicine Team phone at [phone] for questions and concerns.  For individual provider: See Shea Evans

## 2022-02-05 NOTE — Progress Notes (Signed)
Progress Note   Patient: Arthur Freeman DVV:[id number] DOB: 1960/06/28 DOA: 02/03/2022     1 DOS: the patient was seen and examined on 02/05/2022   Brief hospital course: 61 year old African-American male presents today emergency room by EMS on 27th after calling [id number] shortness of breath. He was in respiratory distress sats were 88% and Route placed on 15 L not rebreather. Patient was admitted in the intensive care unit. Currently he is on high flow nasal cannula oxygen 8 L per minute.  Resume care today. Patient complains of significant bone pains. He is tearful since his pain was not under control. Palliative care nurse practitioner Asencion Gowda in the room with me. Family at bedside. Discussed pain regimen with patient and he is in agreement.    Assessment and Plan: Arthur Freeman is a 61 y.o. male with a history of hypertension, emphysema, stage IV lung cancer who is brought to the ED by EMS due to respiratory distress.  Per EMS, patient called 911 due to shortness of breath.  When they arrived, he met them on his porch and walk to the ambulance.  However, during transport he became increasingly somnolent with respiratory distress.  He arrives on 15 L nonrebreather with oxygen saturation of 88%.  Acute hypoxic/hypercapnia respiratory failure secondary to COPD exacerbation with pulmonary edema in the setting of stage IV metastatic lung adenocarcinoma tobacco abuse chronic -- patient was intubated and placed on the ventilator. -- He was extubated yesterday. -- Continue broad-spectrum antibiotic -- IV steroid -- currently on high flow nasal cannula oxygen wean as tolerated -- PRN nebulizers and breathing treatment -- palliative care consulted to assist with [place] discussion--- meeting with family tomorrow  Sepsis secondary to pneumonia in the setting of lung cancer -- trended lactic acid down -- continue IV antibiotics -- sepsis improving  Pulmonary edema in the setting of  hypertensive urgency now improved -- follow-up echo  Electrolyte abnormality --- pharmacy to replete  Nutrition Status: Nutrition Problem: Severe Malnutrition Etiology: chronic illness (lung cancer, COPD) Signs/Symptoms: severe fat depletion, severe muscle depletion follow-up dietitian recommendations  Chronic pain syndrome due to metastatic lung cancer -- will give 2 mg morphine IV PRN along with oxycodone IR for breakthrough -- at discharge consider giving long-acting morphine two times a day along with PO oxycodone IR PRN -- patient follows her [facility]  Patient seen by palliative care. Patient is a DNR DNI        Subjective: patient very tearful due to pain not adequately controlled family at bedside  Physical Exam: mild to moderate distress due to pain Vitals:   02/05/22 1000 02/05/22 1100 02/05/22 1107 02/05/22 1200  BP: 137/88 (!) 150/90  (!) 147/88  Pulse: 93   95  Resp: 13 15  17   Temp: 98.6 F (37 C) 98.8 F (37.1 C)  99 F (37.2 C)  TempSrc:      SpO2:   95%   Weight:      Height:       Alert oriented times three critically ill lying in bed on high flow nasal cannula oxygen cachectic malnourished cardiovascular both heart sounds normal no murmur lungs distant breath sounds diminished bilateral basilar abdomen soft GU Foley placed extremities warm and dry  Family Communication: family at bedside  Disposition: Status is: Inpatient Remains inpatient appropriate because: sepsis, acute on chronic COPD exacerbation still on high flow nasal cannula oxygen  Planned Discharge Destination:  likely home with hospice    Time spent:  35 minutes  Author: Fritzi Mandes, MD 02/05/2022 3:19 PM  For on call review [email or web address].

## 2022-02-06 LAB — CBC WITH DIFFERENTIAL/PLATELET
Abs Immature Granulocytes: 0.09 10*3/uL — ABNORMAL HIGH (ref 0.00–0.07)
Basophils Absolute: 0 10*3/uL (ref 0.0–0.1)
Basophils Relative: 0 %
Eosinophils Absolute: 0 10*3/uL (ref 0.0–0.5)
Eosinophils Relative: 0 %
HCT: 28.2 % — ABNORMAL LOW (ref 39.0–52.0)
Hemoglobin: 8.8 g/dL — ABNORMAL LOW (ref 13.0–17.0)
Immature Granulocytes: 1 %
Lymphocytes Relative: 2 %
Lymphs Abs: 0.4 10*3/uL — ABNORMAL LOW (ref 0.7–4.0)
MCH: 30.8 pg (ref 26.0–34.0)
MCHC: 31.2 g/dL (ref 30.0–36.0)
MCV: 98.6 fL (ref 80.0–100.0)
Monocytes Absolute: 0.9 10*3/uL (ref 0.1–1.0)
Monocytes Relative: 6 %
Neutro Abs: 14.2 10*3/uL — ABNORMAL HIGH (ref 1.7–7.7)
Neutrophils Relative %: 91 %
Platelets: 239 10*3/uL (ref 150–400)
RBC: 2.86 MIL/uL — ABNORMAL LOW (ref 4.22–5.81)
RDW: 20.7 % — ABNORMAL HIGH (ref 11.5–15.5)
WBC: 15.7 10*3/uL — ABNORMAL HIGH (ref 4.0–10.5)
nRBC: 0 % (ref 0.0–0.2)

## 2022-02-06 LAB — BASIC METABOLIC PANEL
Anion gap: 6 (ref 5–15)
BUN: 25 mg/dL — ABNORMAL HIGH (ref 8–23)
CO2: 29 mmol/L (ref 22–32)
Calcium: 9.7 mg/dL (ref 8.9–10.3)
Chloride: 96 mmol/L — ABNORMAL LOW (ref 98–111)
Creatinine, Ser: 0.63 mg/dL (ref 0.61–1.24)
GFR, Estimated: >60 mL/min (ref >60)
Glucose, Bld: 125 mg/dL — ABNORMAL HIGH (ref 70–99)
Potassium: 4.6 mmol/L (ref 3.5–5.1)
Sodium: 131 mmol/L — ABNORMAL LOW (ref 135–145)

## 2022-02-06 LAB — PHOSPHORUS: Phosphorus: 1.9 mg/dL — ABNORMAL LOW (ref 2.5–4.6)

## 2022-02-06 LAB — GLUCOSE, CAPILLARY
Glucose-Capillary: 139 mg/dL — ABNORMAL HIGH (ref 70–99)
Glucose-Capillary: 80 mg/dL (ref 70–99)

## 2022-02-06 LAB — MAGNESIUM: Magnesium: 1.7 mg/dL (ref 1.7–2.4)

## 2022-02-06 LAB — SODIUM: Sodium: 134 mmol/L — ABNORMAL LOW (ref 135–145)

## 2022-02-06 MED ORDER — LORAZEPAM 1 MG PO TABS: 1.0000 mg | ORAL_TABLET | ORAL | 0 refills | Status: DC | PRN

## 2022-02-06 MED ORDER — IPRATROPIUM-ALBUTEROL 0.5-2.5 (3) MG/3ML IN SOLN
3.0000 mL | Freq: Once | RESPIRATORY_TRACT | Status: AC
  Administered 2022-02-06: 3 mL via RESPIRATORY_TRACT

## 2022-02-06 MED ORDER — MORPHINE SULFATE ER 60 MG PO TBCR: 60.0000 mg | EXTENDED_RELEASE_TABLET | Freq: Two times a day (BID) | ORAL | 0 refills | Status: DC

## 2022-02-06 MED ORDER — BIOTENE DRY MOUTH MT LIQD: 15.0000 mL | OROMUCOSAL | Status: DC | PRN

## 2022-02-06 MED ORDER — MAGNESIUM SULFATE IN D5W 1-5 GM/100ML-% IV SOLN
1.0000 g | Freq: Once | INTRAVENOUS | Status: AC
  Administered 2022-02-06: 1 g via INTRAVENOUS
  Filled 2022-02-06: qty 100

## 2022-02-06 MED ORDER — MORPHINE SULFATE ER 60 MG PO TBCR: 60.0000 mg | EXTENDED_RELEASE_TABLET | Freq: Two times a day (BID) | ORAL | 0 refills | Status: AC

## 2022-02-06 MED ORDER — LORAZEPAM 2 MG/ML PO CONC: 1.0000 mg | ORAL | Status: DC | PRN

## 2022-02-06 MED ORDER — POLYVINYL ALCOHOL 1.4 % OP SOLN: 1.0000 [drp] | Freq: Four times a day (QID) | OPHTHALMIC | Status: DC | PRN

## 2022-02-06 MED ORDER — ACETAMINOPHEN 325 MG PO TABS: 650.0000 mg | ORAL_TABLET | Freq: Four times a day (QID) | ORAL | Status: DC | PRN

## 2022-02-06 MED ORDER — LORAZEPAM 1 MG PO TABS
1.0000 mg | ORAL_TABLET | ORAL | 0 refills | Status: AC | PRN
Start: 2022-02-06 — End: ?

## 2022-02-06 MED ORDER — OXYCODONE HCL 5 MG PO TABS
5.0000 mg | ORAL_TABLET | ORAL | Status: DC | PRN
  Administered 2022-02-06: 5 mg via ORAL
  Administered 2022-02-06: 10 mg via ORAL
  Filled 2022-02-06 (×2): qty 2

## 2022-02-06 MED ORDER — LORAZEPAM 1 MG PO TABS
1.0000 mg | ORAL_TABLET | ORAL | Status: DC | PRN
  Administered 2022-02-06 (×2): 1 mg via ORAL
  Filled 2022-02-06 (×2): qty 1

## 2022-02-06 MED ORDER — ACETAMINOPHEN 650 MG RE SUPP: 650.0000 mg | Freq: Four times a day (QID) | RECTAL | Status: DC | PRN

## 2022-02-06 MED ORDER — OXYCODONE HCL 5 MG PO TABS: 5.0000 mg | ORAL_TABLET | ORAL | 0 refills | Status: AC | PRN

## 2022-02-06 MED ORDER — OXYCODONE HCL 5 MG PO TABS: 5.0000 mg | ORAL_TABLET | ORAL | 0 refills | Status: DC | PRN

## 2022-02-06 MED ORDER — LORAZEPAM 2 MG/ML IJ SOLN: 1.0000 mg | INTRAMUSCULAR | Status: DC | PRN

## 2022-02-06 MED ORDER — DEXTROSE 5 % IV SOLN
30.0000 mmol | Freq: Once | INTRAVENOUS | Status: DC
Start: 2022-02-06 — End: 2022-02-06
  Filled 2022-02-06: qty 10

## 2022-02-06 MED ORDER — DOXYCYCLINE HYCLATE 100 MG PO TBEC: 100.0000 mg | DELAYED_RELEASE_TABLET | Freq: Two times a day (BID) | ORAL | 0 refills | Status: AC

## 2022-02-06 MED ORDER — ONDANSETRON 4 MG PO TBDP: 4.0000 mg | ORAL_TABLET | Freq: Four times a day (QID) | ORAL | Status: DC | PRN

## 2022-02-06 MED ORDER — FUROSEMIDE 10 MG/ML IJ SOLN
40.0000 mg | Freq: Once | INTRAMUSCULAR | Status: AC
  Administered 2022-02-06: 40 mg via INTRAVENOUS
  Filled 2022-02-06: qty 4

## 2022-02-06 MED ORDER — STERILE WATER FOR INJECTION IJ SOLN
INTRAMUSCULAR | Status: AC
  Administered 2022-02-06: 10 mL
  Filled 2022-02-06: qty 10

## 2022-02-06 MED ORDER — ONDANSETRON HCL 4 MG/2ML IJ SOLN: 4.0000 mg | Freq: Four times a day (QID) | INTRAMUSCULAR | Status: DC | PRN

## 2022-02-06 MED ORDER — MORPHINE SULFATE (PF) 2 MG/ML IV SOLN
2.0000 mg | Freq: Once | INTRAVENOUS | Status: AC
  Administered 2022-02-06: 2 mg via INTRAVENOUS
  Filled 2022-02-06: qty 1

## 2022-02-06 MED ORDER — CARVEDILOL 12.5 MG PO TABS
12.5000 mg | ORAL_TABLET | Freq: Two times a day (BID) | ORAL | Status: DC
  Administered 2022-02-06 (×2): 12.5 mg via ORAL
  Filled 2022-02-06 (×2): qty 1

## 2022-02-06 NOTE — Progress Notes (Addendum)
Daily Progress Note   Patient Name: Arthur Freeman       Date: 02/06/2022 DOB: 30-Jan-1961  Age: 61 y.o. MRN#: [id number] Attending Physician: Enzo Bi, MD Primary Care Physician: [facility] [place] Date: 02/03/2022  Reason for Consultation/Follow-up: Establishing goals of care  Subjective: Notes and labs reviewed. In to see patient. In regards to pain regimen, he states his pain has not been below a 6/10 since we spoke yesterday. Daughter and other family members are at bedside. Discussed his status and his thoughts and feelings on the treatments with [facility].   We discussed his diagnosis, prognosis, GOC, EOL wishes disposition and options.  Created space and opportunity for patient  to explore thoughts and feelings regarding current medical information.   A detailed discussion was had today regarding advanced directives.  Concepts specific to code status, artifical feeding and hydration, IV antibiotics and rehospitalization were discussed.  The difference between an aggressive medical intervention path and a comfort care path was discussed.  Values and goals of care important to patient and family were attempted to be elicited.  Discussed limitations of medical interventions to prolong quality of life in some situations and discussed the concept of human mortality.  Patient confirms he wants to focus on comfort and QOL for what time he has left on this earth. He no longer wants treatment from [facility].   I completed a MOST form today and the signed original was placed in the chart. The form was scanned and sent to medical records for it to be uploaded under ACP tab in Epic. A photocopy was also placed in the chart to be scanned into EMR. The patient outlined their wishes for the  following treatment decisions:  Cardiopulmonary Resuscitation: Do Not Attempt Resuscitation (DNR/No CPR)  Medical Interventions: Comfort Measures: Keep clean, warm, and dry. Use medication by any route, positioning, wound care, and other measures to relieve pain and suffering. Use oxygen, suction and manual treatment of airway obstruction as needed for comfort. Do not transfer to the hospital unless comfort needs cannot be met in current location.  Antibiotics: No antibiotics (use other measures to relieve symptoms) Finish current course associated with this admission only  IV Fluids: No IV fluids (provide other measures to ensure comfort)  Feeding Tube: No feeding tube  Length of Stay: 2  Current Medications: Scheduled Meds:   budesonide (PULMICORT) nebulizer solution  0.25 mg Nebulization BID   carvedilol  12.5 mg Oral BID WC   Chlorhexidine Gluconate Cloth  6 each Topical Daily   feeding supplement  237 mL Oral TID BM   folic acid  1 mg Oral Daily   methylPREDNISolone (SOLU-MEDROL) injection  20 mg Intravenous Q12H   multivitamin with minerals  1 tablet Oral Daily   nicotine  21 mg Transdermal Daily   pantoprazole  40 mg Oral Daily    Continuous Infusions:  ceFEPime (MAXIPIME) IV Stopped (02/06/22 0612)   doxycycline (VIBRAMYCIN) IV Stopped (02/06/22 0301)   sodium phosphate 30 mmol in dextrose 5 % 250 mL infusion      PRN Meds: acetaminophen **OR** acetaminophen, antiseptic oral rinse, docusate sodium, ipratropium-albuterol, LORazepam **OR** LORazepam **OR** LORazepam, morphine injection, ondansetron **OR** ondansetron (ZOFRAN) IV, mouth rinse, oxyCODONE, polyethylene glycol, polyvinyl alcohol  Physical Exam Pulmonary:     Effort: Pulmonary effort is normal.  Neurological:     Mental Status: He is alert.             Vital Signs: BP (!) 182/103   Pulse 99   Temp 97.9 F (36.6 C) (Oral)   Resp 16   Ht _0  (1.778 m)   Wt 54.1 kg   SpO2 95%   BMI 17.11 kg/m   SpO2: SpO2: 95 % O2 Device: O2 Device: High Flow Nasal Cannula O2 Flow Rate: O2 Flow Rate (L/min): 6 L/min  Intake/output summary:  Intake/Output Summary (Last 24 hours) at 02/06/2022 1052 Last data filed at 02/06/2022 1324 Gross per 24 hour  Intake 3217.91 ml  Output 3055 ml  Net 162.91 ml   LBM:   Baseline Weight: Weight: 55.2 kg (Office note from 01/24/22) Most recent weight: Weight: 54.1 kg   Patient Active Problem List   Diagnosis Date Noted   Acute respiratory failure ([place]) 02/04/2022   Protein-calorie malnutrition, severe 02/04/2022   Acute pulmonary edema (HCC)    Malignant neoplasm of right lung stage 4 (HCC)    Cough with hemoptysis 09/22/2021   Hypertension    Tobacco use disorder    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, 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    Hypertensive urgency    Emphysema lung (HCC)    Chest pain     Palliative Care Assessment & Plan    Recommendations/Plan: Home with hospice Complete current course of antibiotics.  Comfort medications adjusted for continued pain not below a 6/10.     Code Status:    Code Status Orders  (From admission, onward)           Start     Ordered   02/06/22 0953  Do not attempt resuscitation (DNR)  Continuous       Question Answer Comment  In the event of cardiac or respiratory ARREST Do not call a "code blue"   In the event of cardiac or respiratory ARREST Do not perform Intubation, CPR, defibrillation or ACLS   In the event of cardiac or respiratory ARREST Use medication by any route, position, wound care, and other measures to relive pain and suffering. May use oxygen, suction and manual treatment of airway obstruction as needed for comfort.   Comments MOST form on chart.      02/06/22 0954           Code Status History     Date Active Date Inactive Code Status Order ID Comments User  Context   02/05/2022 1557 02/06/2022 0954 DNR [id number]  Asencion Gowda, NP Inpatient   02/04/2022 0041 02/05/2022 1557 Full Code  [id number]  Rust-Chester, Huel Cote, NP ED   09/22/2021 0148 09/22/2021 1937 Full Code [id number]  Athena Masse, MD ED       Prognosis:  < 6 months   Care plan was discussed with team via epic chat  Thank you for allowing the Palliative Medicine Team to assist in the care of this patient.   Asencion Gowda, NP  Please contact Palliative Medicine Team phone at [phone] for questions and concerns.

## 2022-02-06 NOTE — TOC Transition Note (Signed)
Transition of Care [facility]) - CM/SW Discharge Note   Patient Details  Name: Arthur Freeman MRN: [id number] Date of Birth: 1961/01/29  [facility] CM/SW Contact:  Shelbie Hutching, RN Phone Number: 02/06/2022, 3:30 PM   Clinical Narrative:    Patient will discharge home with hospice this afternoon.  Equipment will be delivered to the patient's home within the next hour to hour and half.  Daughter will be staying with patient.  EMS transport has been arranged for after 5:30 pm.     Final next level of care: Home w Hospice Care Barriers to Discharge: Barriers Resolved   Patient Goals and CMS Choice Patient states their goals for this hospitalization and ongoing recovery are:: wants to go home with hospice care CMS Medicare.gov Compare Post Acute Care list provided to:: Patient Choice offered to / list presented to : Patient  Discharge Placement                Patient to be transferred to facility by: EMS to take home Name of family member notified: Tamika- daughter    Discharge Plan and Services   Discharge Planning Services: CM Consult Post Acute Care Choice: Hospice          DME Arranged: Hospital bed, Lightweight manual wheelchair with seat cushion, 3-N-1 DME Agency: Other - Comment (Susan Moore) Date DME Agency Contacted: 02/06/22 Time DME Agency Contacted: 1100 Representative spoke with at DME Agency: [place] Arranged: NA          Social Determinants of Health ([place]) Interventions     Readmission Risk Interventions    02/06/2022   12:54 PM  Readmission Risk Prevention Plan  Transportation Screening Complete  PCP or Specialist Appt within 3-5 Days Complete  HRI or [place] Complete  Social Work Consult for [place] Planning/Counseling Complete  Palliative Care Screening Complete  Medication Review Press photographer) Complete

## 2022-02-06 NOTE — Progress Notes (Signed)
Verbal report given to [facility] Teer, paramedic.  Discharge instructions and forms given to EMS staff.  Patient moved to stretcher.  Patient taken out via stretcher.

## 2022-02-06 NOTE — Progress Notes (Signed)
Manufacturing engineer [facility]) Hospital Liaison Note   Received request from Transitions of Care Manager, Pryor Montes, for hospice services at home after discharge. Chart and patient information under review by [facility] physician. Hospice eligibility approved.   Spoke with patient & Daughter/Tamika to initiate education related to hospice philosophy, services, and team approach to care. Both verbalized understanding of information given. Per discussion, the plan is for patient to discharge home via [facility] once cleared to DC.    DME needs discussed. Patient has the following equipment in the home (Purchased privately): nebulizer Patient requests the following equipment for delivery: [facility] bed & table [facility] Nebulizer Mask (mask only)  Address verified and is correct in the chart. Elwin Sleight is the family member to contact to arrange time of equipment delivery.    Please send signed and completed DNR home with patient/family. Please provide prescriptions at discharge as needed to ensure ongoing symptom management.    [facility] information and contact numbers given to family & above information shared with TOC.   Please call with any questions/concerns.    Thank you for the opportunity to participate in this patient's care.   Daphene Calamity, MSW [facility] Liaison  [phone]

## 2022-02-06 NOTE — Consult Note (Signed)
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 Pharmacy consulted for mgmt of electrolytes while in CCU.  Goal of Therapy:  [place] WNL  Plan:  Scr stable; UOP 1.53ml/k/h K 4.9> 4.6:  WNL, no further repletion at this time. (Lasix IV 40mg  x1 8/30) Mg 2.0> 1.7: Will give MgSO4 1g IV x1 (downtrending and received diuresis 8/30 AM) Phos 3.3>1.9:  Will replete with 83mmol of IV NaPhos. Appears to be degree of refeeding.  Na 130> 131 : likely dilutional with wt of 133 lbs>119 lbs and dry wt of 113 lbs. Pt will be receiving some sodium incidental to phos repletion above. Otherwise, CTM with correction of fluid balance. CTM daily with AM labs and repletion PRN.  Lorna Dibble ,PharmD Clinical Pharmacist 02/06/2022 7:50 AM

## 2022-02-06 NOTE — Progress Notes (Signed)
Ativan 1mg  PO given for anxiety.

## 2022-02-06 NOTE — TOC Initial Note (Signed)
Transition of Care [facility]) - Initial/Assessment Note    Patient Details  Name: Arthur Freeman MRN: [id number] Date of Birth: Feb 14, 1961  Transition of Care [facility]) CM/SW Contact:    Shelbie Hutching, RN Phone Number: 02/06/2022, 1:00 PM  Clinical Narrative:                 Patient admitted to the hospital for acute respiratory failure requiring acute oxygen currently on HFNC 6L.  Patient will be discharge on regular [place].  Palliative care met with patient at the bedside this morning, patient's family at the bedside, daughter and son.  Patient would like to go home with hospice.  He chooses [facility] for services.  Referral given to Daphene Calamity with [facility].  Lorayne Bender went and was able to speak with family at the bedside.  They all agree in going home with hospice, patient's daughter will be staying with him when he goes home.  Patient needs a hospital bed, wheelchair, bedside commode, and oxygen.  Lorayne Bender has ordered all equipment to be delivered today.  Patient wants to go home today, he will need EMS transport.  Daughter Elwin Sleight is going to call this RNCM once the equipment is delivered then [facility] will arrange EMS transport.   Expected Discharge Plan: Home w Hospice Care Barriers to Discharge: Equipment Delay   Patient Goals and CMS Choice Patient states their goals for this hospitalization and ongoing recovery are:: wants to go home with hospice care CMS Medicare.gov Compare Post Acute Care list provided to:: Patient Choice offered to / list presented to : Patient  Expected Discharge Plan and Services Expected Discharge Plan: [place]   Discharge Planning Services: CM Consult Post Acute Care Choice: Hospice Living arrangements for the past 2 months: [place]                 DME Arranged: Hospital bed, Lightweight manual wheelchair with seat cushion, 3-N-1 DME Agency: Other - Comment ([place]) Date DME Agency Contacted: 02/06/22 Time DME Agency  Contacted: 1100 Representative spoke with at DME Agency: [place] Arranged: NA          Prior Living Arrangements/Services Living arrangements for the past 2 months: Single Family Home Lives with:: Self Patient language and need for interpreter reviewed:: Yes Do you feel safe going back to the place where you live?: Yes      Need for Family Participation in Patient Care: Yes (Comment) Care giver support system in place?: Yes (comment)   Criminal Activity/Legal Involvement Pertinent to Current Situation/Hospitalization: No - Comment as needed  Activities of Daily Living      Permission Sought/Granted Permission sought to share information with : Case Manager, Family Supports, Other (comment) Permission granted to share information with : Yes, Verbal Permission Granted  Share Information with NAME: Armani Brar  Permission granted to share info w AGENCY: [place] granted to share info w Relationship: daughter  Permission granted to share info w Contact Information: [phone]  Emotional Assessment Appearance:: Appears older than stated age Attitude/Demeanor/Rapport: Engaged Affect (typically observed): Accepting Orientation: : Oriented to Self, Oriented to Place, Oriented to  Time, Oriented to Situation Alcohol / Substance Use: Not Applicable Psych Involvement: No (comment)  Admission diagnosis:  Acute respiratory failure (HCC) [J96.00] Acute pulmonary edema (HCC) [J81.0] Acute respiratory failure with hypoxia (HCC) [J96.01] Malignant neoplasm of right lung stage 4 ([place]) [C34.91] Patient Active Problem List   Diagnosis Date Noted  Acute respiratory failure ([place]) 02/04/2022   Protein-calorie malnutrition, severe 02/04/2022   Acute pulmonary edema (HCC)    Malignant neoplasm of right lung stage 4 (HCC)    Cough with hemoptysis 09/22/2021   Hypertension    Tobacco use disorder    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, 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    Hypertensive  urgency    Emphysema lung ([place])    Chest pain    PCP:  [place]:   [id number] [place] [id number]: [phone]: [phone]    Social Determinants of Health (SDOH) Interventions    Readmission Risk Interventions    02/06/2022   12:54 PM  Readmission Risk Prevention Plan  Transportation Screening Complete  PCP or Specialist Appt within 3-5 Days Complete  HRI or HGlen GardnerComplete  Social Work Consult for [place]anning/Counseling Complete  Palliative Care Screening Complete  Medication Review (Press photographer Complete

## 2022-02-06 NOTE — Progress Notes (Signed)
       CROSS COVER NOTE  NAME: Arthur Freeman MRN: [id number] DOB : August 29, 1960    Date of Service   02/06/2022   HPI/Events of Note   Contact by nursing reporting patient experiencing dyspnea upon awakening this morning.  On assessment Arthur Freeman has Rhonchi in bilateral upper lung fields and expiratory wheezing. SPO2 96 % on HFNC at 6L RR 18. He's also reporting ongoing (R) chest pain that is not a new report tonight.  Interventions   Plan: Duoneb x1 Lasix Morphine       This document was prepared using Dragon voice recognition software and may include unintentional dictation errors.  Neomia Glass DNP, MHA, FNP-BC Nurse Practitioner [facility] Pager [phone]

## 2022-02-07 NOTE — Discharge Summary (Signed)
Physician Discharge Summary   Arthur Freeman  male DOB: 11/28/1960  GQB:[id number]  PCP: [facility] [place] date: 02/03/2022 Discharge date: 02/06/2022  Admitted From: home Disposition:  home with hospice CODE STATUS: DNR   Hospital Course:  For full details, please see H&P, progress notes, consult notes and ancillary notes.  Briefly,  Arthur Freeman is a 61 y.o. male with a history of hypertension, emphysema, stage IV lung cancer who is brought to the ED by EMS due to respiratory distress.  Per EMS, when they arrived, he met them on his porch and walk to the ambulance.  However, during transport he became increasingly somnolent with respiratory distress.  He arrives on 15 L nonrebreather with oxygen saturation of 88%.   Acute hypoxic and hypercapnia respiratory failure 2/2 COPD exacerbation  pulmonary edema in the setting of stage IV metastatic lung adenocarcinoma tobacco abuse chronic -- patient was intubated and placed on the ventilator. -- He was extubated and weaned to 6L [place] prior to discharge home with hospice. --Pt received steroid and 4 days of cefepime and doxycycline.  Pt was discharged to finish 3 more days of doxycycline. --Palliative care was consulted and pt decided to go home with hospice.   Sepsis secondary to pneumonia in the setting of lung cancer  Pulmonary edema in the setting of hypertensive urgency   Hypokalemia Hypomag Hypophos --repleted   Nutrition Status: Nutrition Problem: Severe Malnutrition Etiology: chronic illness (lung cancer, COPD) Signs/Symptoms: severe fat depletion, severe muscle depletion   Chronic pain syndrome due to metastatic lung cancer --discharged on 5 days of MS contin, oxycodone.  Further refills per hospice provider.  Anxiety due to cancer --discharged on 5 days of Ativan.  Further refills per hospice provider.  Acute Metabolic encephalopathy, now resolved  --2/2 hypoxia  Trop elevation 2/2  Demand ischemia   Discharge Diagnoses:  Principal Problem:   Acute respiratory failure (HCC) Active Problems:   Acute pulmonary edema (HCC)   Malignant neoplasm of right lung stage 4 (HCC)   Protein-calorie malnutrition, severe   30 Day Unplanned Readmission Risk Score    Flowsheet Row ED to Hosp-Admission (Discharged) from 02/03/2022 in [place] ICU/CCU  30 Day Unplanned Readmission Risk Score (%) 25.56 Filed at 02/06/2022 1600       This score is the patient's risk of an unplanned readmission within 30 days of being discharged (0 -100%). The score is based on dignosis, age, lab data, medications, orders, and past utilization.   Low:  0-14.9   Medium: 15-21.9   High: 22-29.9   Extreme: 30 and above         Discharge Instructions:  Allergies as of 02/06/2022       Reactions   Lisinopril    Norvasc [amlodipine Besylate]         Medication List     STOP taking these medications    aspirin 81 MG chewable tablet   dexamethasone 4 MG tablet Commonly known as: DECADRON   folic acid 828 MCG tablet Commonly known as: FOLVITE   ondansetron 8 MG tablet Commonly known as: ZOFRAN   traMADol 50 MG tablet Commonly known as: ULTRAM       TAKE these medications    albuterol 108 (90 Base) MCG/ACT inhaler Commonly known as: VENTOLIN HFA Inhale 2 puffs into the lungs every 4 (four) hours as needed.   carvedilol 12.5 MG tablet Commonly known as: COREG Take 12.5 mg by mouth 2 (two)  times daily.   doxycycline 100 MG EC tablet Commonly known as: DORYX Take 1 tablet (100 mg total) by mouth 2 (two) times daily for 3 days.   fluticasone-salmeterol 250-50 MCG/ACT Aepb Commonly known as: ADVAIR Inhale into the lungs.   hydrochlorothiazide 25 MG tablet Commonly known as: HYDRODIURIL Take 25 mg by mouth daily.   ipratropium-albuterol 0.5-2.5 (3) MG/3ML Soln Commonly known as: DUONEB Take 3 mLs by nebulization every 4 (four) hours as  needed.   LORazepam 1 MG tablet Commonly known as: ATIVAN Take 1 tablet (1 mg total) by mouth every 4 (four) hours as needed for anxiety.   losartan 100 MG tablet Commonly known as: COZAAR Take 100 mg by mouth daily.   morphine 60 MG 12 hr tablet Commonly known as: MS Contin Take 1 tablet (60 mg total) by mouth every 12 (twelve) hours for 5 days.   OLANZapine 5 MG tablet Commonly known as: ZYPREXA Take 5 mg by mouth as directed. Take 1 tablet at bedtime on nights 1-4 of chemo cycle   oxyCODONE 5 MG immediate release tablet Commonly known as: Oxy IR/ROXICODONE Take 1-2 tablets (5-10 mg total) by mouth every 4 (four) hours as needed for up to 5 days for severe pain or moderate pain.   prochlorperazine 10 MG tablet Commonly known as: COMPAZINE Take 10 mg by mouth every 6 (six) hours as needed for nausea or vomiting.   Symbicort 160-4.5 MCG/ACT inhaler Generic drug: budesonide-formoterol Inhale 2 puffs into the lungs 2 (two) times daily.          Allergies  Allergen Reactions   Lisinopril    Norvasc [Amlodipine Besylate]      The results of significant diagnostics from this hospitalization (including imaging, microbiology, ancillary and laboratory) are listed below for reference.   Consultations:   Procedures/Studies: DG Chest Port 1 View  Result Date: 02/05/2022 CLINICAL DATA:  Hypoxia EXAM: PORTABLE CHEST 1 VIEW COMPARISON:  Two days ago FINDINGS: Right perihilar mass with adjacent interstitial and airspace density that is unchanged. Generalized interstitial coarsening with apical lucency in the setting of emphysema. Porta catheter on the left with tip at the SVC. Stable heart size. Biapical lucency. No convincing pneumothorax. IMPRESSION: Stable chest after extubation. Electronically Signed   By: Jorje Guild M.D.   On: 02/05/2022 06:25   ECHOCARDIOGRAM COMPLETE  Result Date: 02/04/2022    ECHOCARDIOGRAM REPORT   Patient Name:   Arthur Freeman Date of Exam:  02/04/2022 Medical Rec #:  [id number]         Height:       70.0 in Accession #:    [id number]        Weight:       131.6 lb Date of Birth:  12-09-1960         BSA:          1.747 m Patient Age:    73 years          BP:           93/58 mmHg Patient Gender: M                 HR:           77 bpm. Exam Location:  [facility] Procedure: 2D Echo, Cardiac Doppler and Color Doppler Indications:     I42.9 Cardiomyopathy  History:         Patient has no prior history of Echocardiogram examinations.  Lung cancer and COPD; Risk Factors:Current Smoker and                  Hypertension.  Sonographer:     Rosalia Hammers Referring Phys:  [id number] [place] Diagnosing Phys: Kathlyn Sacramento MD  Sonographer Comments: Suboptimal apical window and echo performed with patient supine and on artificial respirator. Image acquisition challenging due to patient body habitus, Image acquisition challenging due to COPD and Image acquisition challenging due  to respiratory motion. IMPRESSIONS  1. Left ventricular ejection fraction, by estimation, is 55 to 60%. The left ventricle has normal function. The left ventricle has no regional wall motion abnormalities. There is mild left ventricular hypertrophy. Left ventricular diastolic parameters are indeterminate.  2. Right ventricular systolic function is moderately reduced. The right ventricular size is mildly enlarged. Mildly increased right ventricular wall thickness. There is severely elevated pulmonary artery systolic pressure.  3. Left atrial size was mildly dilated.  4. Right atrial size was mildly dilated.  5. The mitral valve is normal in structure. No evidence of mitral valve regurgitation. No evidence of mitral stenosis.  6. Tricuspid valve regurgitation is mild to moderate.  7. The aortic valve has an indeterminant number of cusps. Aortic valve regurgitation is not visualized. Aortic valve sclerosis/calcification is present, without any evidence of aortic stenosis.  Possible bicuspid aortic valve.  8. The inferior vena cava is dilated in size with <50% respiratory variability, suggesting right atrial pressure of 15 mmHg. FINDINGS  Left Ventricle: Left ventricular ejection fraction, by estimation, is 55 to 60%. The left ventricle has normal function. The left ventricle has no regional wall motion abnormalities. The left ventricular internal cavity size was normal in size. There is  mild left ventricular hypertrophy. Left ventricular diastolic parameters are indeterminate. Right Ventricle: The right ventricular size is mildly enlarged. Mildly increased right ventricular wall thickness. Right ventricular systolic function is moderately reduced. There is severely elevated pulmonary artery systolic pressure. The tricuspid regurgitant velocity is 3.38 m/s, and with an assumed right atrial pressure of 15 mmHg, the estimated right ventricular systolic pressure is 00.9 mmHg. Left Atrium: Left atrial size was mildly dilated. Right Atrium: Right atrial size was mildly dilated. Pericardium: There is no evidence of pericardial effusion. Mitral Valve: The mitral valve is normal in structure. No evidence of mitral valve regurgitation. No evidence of mitral valve stenosis. Tricuspid Valve: The tricuspid valve is normal in structure. Tricuspid valve regurgitation is mild to moderate. No evidence of tricuspid stenosis. Aortic Valve: The aortic valve has an indeterminant number of cusps. Aortic valve regurgitation is not visualized. Aortic valve sclerosis/calcification is present, without any evidence of aortic stenosis. Aortic valve mean gradient measures 4.0 mmHg. Aortic valve peak gradient measures 7.7 mmHg. Aortic valve area, by VTI measures 2.36 cm. Pulmonic Valve: The pulmonic valve was normal in structure. Pulmonic valve regurgitation is not visualized. No evidence of pulmonic stenosis. Aorta: The aortic root is normal in size and structure. Venous: The inferior vena cava is dilated in  size with less than 50% respiratory variability, suggesting right atrial pressure of 15 mmHg. IAS/Shunts: No atrial level shunt detected by color flow Doppler.  LEFT VENTRICLE PLAX 2D LVIDd:         3.84 cm   Diastology LVIDs:         2.97 cm   LV e' medial:    6.31 cm/s LV PW:         1.15 cm   LV E/e' medial:  9.7  LV IVS:        1.02 cm   LV e' lateral:   9.03 cm/s LVOT diam:     2.00 cm   LV E/e' lateral: 6.8 LV SV:         63 LV SV Index:   36 LVOT Area:     3.14 cm  RIGHT VENTRICLE RV Basal diam:  4.29 cm RV S prime:     13.80 cm/s TAPSE (M-mode): 2.3 cm LEFT ATRIUM             Index        RIGHT ATRIUM           Index LA diam:        2.50 cm 1.43 cm/m   RA Area:     22.50 cm LA Vol (A2C):   51.1 ml 29.25 ml/m  RA Volume:   71.90 ml  41.15 ml/m LA Vol (A4C):   68.9 ml 39.43 ml/m LA Biplane Vol: 62.7 ml 35.88 ml/m  AORTIC VALVE AV Area (Vmax):    1.89 cm AV Area (Vmean):   2.00 cm AV Area (VTI):     2.36 cm AV Vmax:           139.00 cm/s AV Vmean:          93.600 cm/s AV VTI:            0.269 m AV Peak Grad:      7.7 mmHg AV Mean Grad:      4.0 mmHg LVOT Vmax:         83.60 cm/s LVOT Vmean:        59.700 cm/s LVOT VTI:          0.202 m LVOT/AV VTI ratio: 0.75  AORTA Ao Root diam: 3.30 cm MITRAL VALVE               TRICUSPID VALVE MV Area (PHT): 3.03 cm    TR Peak grad:   45.7 mmHg MV Decel Time: 250 msec    TR Vmax:        338.00 cm/s MV E velocity: 61.30 cm/s MV A velocity: 60.40 cm/s  SHUNTS MV E/A ratio:  1.01        Systemic VTI:  0.20 m                            Systemic Diam: 2.00 cm Kathlyn Sacramento MD Electronically signed by Kathlyn Sacramento MD Signature Date/Time: 02/04/2022/4:44:07 PM    Final    DG Abdomen 1 View  Result Date: 02/03/2022 CLINICAL DATA:  Check gastric catheter placement EXAM: ABDOMEN - 1 VIEW COMPARISON:  None Available. FINDINGS: Gastric catheter is noted within the stomach. Stomach is distended with air. No free air is seen. Obstructive changes are noted. IMPRESSION:  Gastric catheter within the stomach. Electronically Signed   By: Inez Catalina M.D.   On: 02/03/2022 23:17   DG Chest Portable 1 View  Result Date: 02/03/2022 CLINICAL DATA:  Respiratory distress. Intubated. Known right lower lobe lung mass. EXAM: PORTABLE CHEST 1 VIEW COMPARISON:  PA Lat chest 09/21/2021, chest CTA 09/22/2021. FINDINGS: The patient is intubated with tip of the ETT 3.9 cm from the carina, with left IJ port catheter new from prior studies with its tip about the superior cavoatrial junction. NGT is also in place. The tip is likely at the body of stomach based on the positioning of the proximal side-hole  and the stomach is mildly air distended. There is a large right lower lobe tumor contiguous with the inferior hilum. On CT this measured almost 11 cm. The visualized portion is at least 7.5 x 7 cm on this radiograph. Neoplasm must be assumed until proven otherwise. The cardiac size is normal. The mediastinal configuration is unremarkable with aortic calcific plaque in the arch. There is increased central vascular prominence, generalized interstitial edema and trace pleural effusions. There are increased peritumoral patchy infiltrates in the right lower lobe which could be due to peritumoral pneumonic infiltrates or lymphangitic carcinomatosis. Rest of lungs show no focal airspace disease. Chronic linear scarring right apex. IMPRESSION: 1. Support tubes are adequately inserted. 2. Normal heart size but with increased central vessel prominence and mild generalized interstitial edema. Findings consistent with CHF or fluid overload. Trace pleural effusions. 3. Large right lower lobe tumor contiguous with the hilum with increased surrounding patchy infiltrates, which could be peritumoral pneumonia or lymphangitic carcinomatosis. Electronically Signed   By: Telford Nab M.D.   On: 02/03/2022 23:14      Labs: BNP (last 3 results) Recent Labs    02/03/22 2243  BNP 979.8*   Basic Metabolic  Panel: Recent Labs  Lab 02/03/22 2356 02/04/22 0056 02/04/22 0315 02/04/22 0317 02/04/22 1206 02/05/22 0326 02/05/22 0931 02/05/22 1811 02/05/22 2056 02/06/22 0401  NA 126*  --  126*  --    < > 130* 130* 135 135 131*  134*  K 2.9*  --  4.7  --   --  4.9  --   --   --  4.6  CL 91*  --  92*  --   --  98  --   --   --  96*  CO2 26  --  28  --   --  27  --   --   --  29  GLUCOSE 141*  --  132*  --   --  106*  --   --   --  125*  BUN 15  --  14  --   --  19  --   --   --  25*  CREATININE 0.74  --  0.63  --   --  0.68  --   --   --  0.63  CALCIUM 8.6*  --  8.6*  --   --  9.2  --   --   --  9.7  MG  --  1.6*  --  2.1  --  2.0  --   --   --  1.7  PHOS  --  5.6*  --  4.4  --  3.3  --   --   --  1.9*   < > = values in this interval not displayed.   Liver Function Tests: Recent Labs  Lab 02/03/22 2356  AST 29  ALT 17  ALKPHOS 84  BILITOT 0.3  PROT 6.6  ALBUMIN 2.9*   Recent Labs  Lab 02/03/22 2356  LIPASE 31   No results for input(s): "AMMONIA" in the last 168 hours. CBC: Recent Labs  Lab 02/03/22 2243 02/04/22 0317 02/04/22 0411 02/05/22 0326 02/06/22 0401  WBC 12.2* 14.0* 12.8* 13.5* 15.7*  NEUTROABS 8.1*  --   --  12.3* 14.2*  HGB 11.7* 8.4* 8.6* 7.8* 8.8*  HCT 37.3* 25.7* 26.4* 24.5* 28.2*  MCV 97.4 94.5 95.7 98.0 98.6  PLT 282 198 195 198 239   Cardiac Enzymes: No results for input(s): "  CKTOTAL", "CKMB", "CKMBINDEX", "TROPONINI" in the last 168 hours. BNP: Invalid input(s): "POCBNP" CBG: Recent Labs  Lab 02/04/22 2130 02/05/22 1937 02/05/22 2339 02/06/22 0340 02/06/22 0800  GLUCAP 135* 131* 75 139* 80   D-Dimer No results for input(s): "DDIMER" in the last 72 hours. Hgb A1c No results for input(s): "HGBA1C" in the last 72 hours. Lipid Profile No results for input(s): "CHOL", "HDL", "LDLCALC", "TRIG", "CHOLHDL", "LDLDIRECT" in the last 72 hours. Thyroid function studies No results for input(s): "TSH", "T4TOTAL", "T3FREE", "THYROIDAB" in the last  72 hours.  Invalid input(s): "FREET3" Anemia work up No results for input(s): "VITAMINB12", "FOLATE", "FERRITIN", "TIBC", "IRON", "RETICCTPCT" in the last 72 hours. Urinalysis    Component Value Date/Time   COLORURINE YELLOW (A) 02/03/2022 2245   APPEARANCEUR CLEAR (A) 02/03/2022 2245   APPEARANCEUR CLEAR 11/08/2013 1803   LABSPEC 1.015 02/03/2022 2245   LABSPEC 1.020 11/08/2013 1803   PHURINE 7.0 02/03/2022 2245   GLUCOSEU NEGATIVE 02/03/2022 2245   GLUCOSEU see comment 11/08/2013 1803   HGBUR NEGATIVE 02/03/2022 2245   BILIRUBINUR NEGATIVE 02/03/2022 2245   BILIRUBINUR see comment 11/08/2013 1803   KETONESUR NEGATIVE 02/03/2022 2245   PROTEINUR 100 (A) 02/03/2022 2245   NITRITE NEGATIVE 02/03/2022 2245   LEUKOCYTESUR NEGATIVE 02/03/2022 2245   LEUKOCYTESUR see comment 11/08/2013 1803   Sepsis Labs Recent Labs  Lab 02/04/22 0317 02/04/22 0411 02/05/22 0326 02/06/22 0401  WBC 14.0* 12.8* 13.5* 15.7*   Microbiology Recent Results (from the past 240 hour(s))  Culture, blood (routine x 2)     Status: None (Preliminary result)   Collection Time: 02/03/22 10:39 PM   Specimen: BLOOD LEFT ARM  Result Value Ref Range Status   Specimen Description BLOOD LEFT ARM  Final   Special Requests   Final    BOTTLES DRAWN AEROBIC AND ANAEROBIC Blood Culture results may not be optimal due to an inadequate volume of blood received in culture bottles   Culture   Final    NO GROWTH 4 DAYS Performed at [facility], [address], [place], [place] [address]    Report Status PENDING  Incomplete  Culture, blood (routine x 2)     Status: None (Preliminary result)   Collection Time: 02/03/22 10:43 PM   Specimen: BLOOD  Result Value Ref Range Status   Specimen Description BLOOD LEFT HAND  Final   Special Requests   Final    BOTTLES DRAWN AEROBIC AND ANAEROBIC Blood Culture results may not be optimal due to an inadequate volume of blood received in culture bottles   Culture    Final    NO GROWTH 4 DAYS Performed at [facility], [address] [place], [place] [address]    Report Status PENDING  Incomplete  Urine Culture     Status: None   Collection Time: 02/03/22 10:45 PM   Specimen: Urine, Catheterized  Result Value Ref Range Status   Specimen Description   Final    URINE, CATHETERIZED Performed at [facility], [address], [place], [place] [address]    Special Requests   Final    NONE Performed at [facility], [address], [place], [place] [address]    Culture   Final    NO GROWTH Performed at [facility], [place] [address], [place], [place] [address]    Report Status 02/05/2022 FINAL  Final  SARS Coronavirus 2 by RT PCR (hospital order, performed in [facility] hospital lab) *cepheid single result test*  Anterior Nasal Swab     Status: None   Collection Time: 02/03/22 11:37 PM   Specimen: Anterior Nasal Swab  Result Value Ref Range Status   SARS Coronavirus 2 by RT PCR NEGATIVE NEGATIVE Final    Comment: (NOTE) SARS-CoV-2 target nucleic acids are NOT DETECTED.  The SARS-CoV-2 RNA is generally detectable in upper and lower respiratory specimens during the acute phase of infection. The lowest concentration of SARS-CoV-2 viral copies this assay can detect is 250 copies / mL. A negative result does not preclude SARS-CoV-2 infection and should not be used as the sole basis for treatment or other patient management decisions.  A negative result may occur with improper specimen collection / handling, submission of specimen other than nasopharyngeal swab, presence of viral mutation(s) within the areas targeted by this assay, and inadequate number of viral copies (<250 copies / mL). A negative result must be combined with clinical observations, patient history, and epidemiological information.  Fact Sheet for Patients:   https://www.patel.info/  Fact Sheet for Healthcare  Providers: https://hall.com/  This test is not yet approved or  cleared by the [place] FDA and has been authorized for detection and/or diagnosis of SARS-CoV-2 by FDA under an Emergency Use Authorization (EUA).  This EUA will remain in effect (meaning this test can be used) for the duration of the COVID-19 declaration under Section 564(b)(1) of the Act, 21 U.S.C. section 360bbb-3(b)(1), unless the authorization is terminated or revoked sooner.  Performed at [facility], [place], [place] [address]   Respiratory (~20 pathogens) panel by PCR     Status: None   Collection Time: 02/03/22 11:37 PM   Specimen: Anterior Nasal Swab; Respiratory  Result Value Ref Range Status   Adenovirus NOT DETECTED NOT DETECTED Final   Coronavirus 229E NOT DETECTED NOT DETECTED Final    Comment: (NOTE) The Coronavirus on the Respiratory Panel, DOES NOT test for the novel  Coronavirus (2019 nCoV)    Coronavirus HKU1 NOT DETECTED NOT DETECTED Final   Coronavirus NL63 NOT DETECTED NOT DETECTED Final   Coronavirus OC43 NOT DETECTED NOT DETECTED Final   Metapneumovirus NOT DETECTED NOT DETECTED Final   Rhinovirus / Enterovirus NOT DETECTED NOT DETECTED Final   Influenza A NOT DETECTED NOT DETECTED Final   Influenza B NOT DETECTED NOT DETECTED Final   Parainfluenza Virus 1 NOT DETECTED NOT DETECTED Final   Parainfluenza Virus 2 NOT DETECTED NOT DETECTED Final   Parainfluenza Virus 3 NOT DETECTED NOT DETECTED Final   Parainfluenza Virus 4 NOT DETECTED NOT DETECTED Final   Respiratory Syncytial Virus NOT DETECTED NOT DETECTED Final   Bordetella pertussis NOT DETECTED NOT DETECTED Final   Bordetella Parapertussis NOT DETECTED NOT DETECTED Final   Chlamydophila pneumoniae NOT DETECTED NOT DETECTED Final   Mycoplasma pneumoniae NOT DETECTED NOT DETECTED Final    Comment: Performed at [facility], [place]. [address] [place], [place] [address]      Total time spend on discharging this patient, including the last patient exam, discussing the hospital stay, instructions for ongoing care as it relates to all pertinent caregivers, as well as preparing the medical discharge records, prescriptions, and/or referrals as applicable, is 45 minutes.    Enzo Bi, MD  [facility] 02/07/2022, 7:10 PM

## 2022-02-08 LAB — CULTURE, BLOOD (ROUTINE X 2)
Culture: NO GROWTH
Culture: NO GROWTH

## 2022-03-10 DEATH — deceased
